# Patient Record
Sex: Female | Born: 1965
Health system: Southern US, Community
[De-identification: ages and names within clinical notes are randomized; demographics above are authoritative.]

## PROBLEM LIST (undated history)

## (undated) DIAGNOSIS — G809 Cerebral palsy, unspecified: Secondary | ICD-10-CM

## (undated) DIAGNOSIS — E039 Hypothyroidism, unspecified: Secondary | ICD-10-CM

## (undated) DIAGNOSIS — T7840XA Allergy, unspecified, initial encounter: Secondary | ICD-10-CM

## (undated) DIAGNOSIS — J45909 Unspecified asthma, uncomplicated: Secondary | ICD-10-CM

## (undated) HISTORY — PX: OTHER SURGICAL HISTORY: SHX169

## (undated) HISTORY — DX: Hypothyroidism, unspecified: E03.9

## (undated) HISTORY — PX: TONSILLECTOMY: SUR1361

## (undated) HISTORY — DX: Allergy, unspecified, initial encounter: T78.40XA

## (undated) HISTORY — DX: Unspecified asthma, uncomplicated: J45.909

## (undated) HISTORY — DX: Cerebral palsy, unspecified: G80.9

---

## 2006-05-23 ENCOUNTER — Ambulatory Visit: Payer: Self-pay | Admitting: Internal Medicine

## 2007-05-27 ENCOUNTER — Ambulatory Visit: Payer: Self-pay | Admitting: Internal Medicine

## 2010-07-13 ENCOUNTER — Ambulatory Visit: Payer: Self-pay

## 2011-08-10 DIAGNOSIS — E782 Mixed hyperlipidemia: Secondary | ICD-10-CM | POA: Diagnosis not present

## 2011-08-10 DIAGNOSIS — J45909 Unspecified asthma, uncomplicated: Secondary | ICD-10-CM | POA: Diagnosis not present

## 2011-08-10 DIAGNOSIS — E039 Hypothyroidism, unspecified: Secondary | ICD-10-CM | POA: Diagnosis not present

## 2011-08-10 DIAGNOSIS — G40802 Other epilepsy, not intractable, without status epilepticus: Secondary | ICD-10-CM | POA: Diagnosis not present

## 2011-08-10 DIAGNOSIS — J309 Allergic rhinitis, unspecified: Secondary | ICD-10-CM | POA: Diagnosis not present

## 2011-09-06 ENCOUNTER — Ambulatory Visit: Payer: Self-pay | Admitting: Internal Medicine

## 2011-09-06 DIAGNOSIS — Z1231 Encounter for screening mammogram for malignant neoplasm of breast: Secondary | ICD-10-CM | POA: Diagnosis not present

## 2012-02-07 DIAGNOSIS — Z23 Encounter for immunization: Secondary | ICD-10-CM | POA: Diagnosis not present

## 2012-02-07 DIAGNOSIS — J45909 Unspecified asthma, uncomplicated: Secondary | ICD-10-CM | POA: Diagnosis not present

## 2012-02-07 DIAGNOSIS — Z0000 Encounter for general adult medical examination without abnormal findings: Secondary | ICD-10-CM | POA: Diagnosis not present

## 2012-02-07 DIAGNOSIS — R300 Dysuria: Secondary | ICD-10-CM | POA: Diagnosis not present

## 2012-02-07 DIAGNOSIS — J309 Allergic rhinitis, unspecified: Secondary | ICD-10-CM | POA: Diagnosis not present

## 2012-05-30 DIAGNOSIS — E038 Other specified hypothyroidism: Secondary | ICD-10-CM | POA: Diagnosis not present

## 2012-05-30 DIAGNOSIS — Z0000 Encounter for general adult medical examination without abnormal findings: Secondary | ICD-10-CM | POA: Diagnosis not present

## 2012-08-07 DIAGNOSIS — J45909 Unspecified asthma, uncomplicated: Secondary | ICD-10-CM | POA: Diagnosis not present

## 2012-08-07 DIAGNOSIS — E039 Hypothyroidism, unspecified: Secondary | ICD-10-CM | POA: Diagnosis not present

## 2012-08-07 DIAGNOSIS — R0602 Shortness of breath: Secondary | ICD-10-CM | POA: Diagnosis not present

## 2012-08-07 DIAGNOSIS — J309 Allergic rhinitis, unspecified: Secondary | ICD-10-CM | POA: Diagnosis not present

## 2012-09-25 ENCOUNTER — Ambulatory Visit: Payer: Self-pay

## 2012-09-25 DIAGNOSIS — R922 Inconclusive mammogram: Secondary | ICD-10-CM | POA: Diagnosis not present

## 2012-09-25 DIAGNOSIS — Z1231 Encounter for screening mammogram for malignant neoplasm of breast: Secondary | ICD-10-CM | POA: Diagnosis not present

## 2013-03-03 DIAGNOSIS — Z0000 Encounter for general adult medical examination without abnormal findings: Secondary | ICD-10-CM | POA: Diagnosis not present

## 2013-03-03 DIAGNOSIS — E039 Hypothyroidism, unspecified: Secondary | ICD-10-CM | POA: Diagnosis not present

## 2013-03-03 DIAGNOSIS — J45901 Unspecified asthma with (acute) exacerbation: Secondary | ICD-10-CM | POA: Diagnosis not present

## 2013-03-03 DIAGNOSIS — J309 Allergic rhinitis, unspecified: Secondary | ICD-10-CM | POA: Diagnosis not present

## 2013-06-25 DIAGNOSIS — E559 Vitamin D deficiency, unspecified: Secondary | ICD-10-CM | POA: Diagnosis not present

## 2013-06-25 DIAGNOSIS — E038 Other specified hypothyroidism: Secondary | ICD-10-CM | POA: Diagnosis not present

## 2013-06-25 DIAGNOSIS — Z0000 Encounter for general adult medical examination without abnormal findings: Secondary | ICD-10-CM | POA: Diagnosis not present

## 2013-09-01 DIAGNOSIS — J45909 Unspecified asthma, uncomplicated: Secondary | ICD-10-CM | POA: Diagnosis not present

## 2013-09-01 DIAGNOSIS — J309 Allergic rhinitis, unspecified: Secondary | ICD-10-CM | POA: Diagnosis not present

## 2013-09-01 DIAGNOSIS — E039 Hypothyroidism, unspecified: Secondary | ICD-10-CM | POA: Diagnosis not present

## 2013-12-09 DIAGNOSIS — Z23 Encounter for immunization: Secondary | ICD-10-CM | POA: Diagnosis not present

## 2013-12-30 ENCOUNTER — Ambulatory Visit: Payer: Self-pay

## 2013-12-30 DIAGNOSIS — Z1231 Encounter for screening mammogram for malignant neoplasm of breast: Secondary | ICD-10-CM | POA: Diagnosis not present

## 2014-01-28 DIAGNOSIS — H53031 Strabismic amblyopia, right eye: Secondary | ICD-10-CM | POA: Diagnosis not present

## 2014-01-28 DIAGNOSIS — H50111 Monocular exotropia, right eye: Secondary | ICD-10-CM | POA: Diagnosis not present

## 2014-03-08 DIAGNOSIS — J309 Allergic rhinitis, unspecified: Secondary | ICD-10-CM | POA: Diagnosis not present

## 2014-03-08 DIAGNOSIS — J4520 Mild intermittent asthma, uncomplicated: Secondary | ICD-10-CM | POA: Diagnosis not present

## 2014-03-08 DIAGNOSIS — E039 Hypothyroidism, unspecified: Secondary | ICD-10-CM | POA: Diagnosis not present

## 2014-03-08 DIAGNOSIS — R300 Dysuria: Secondary | ICD-10-CM | POA: Diagnosis not present

## 2014-03-08 DIAGNOSIS — Z0001 Encounter for general adult medical examination with abnormal findings: Secondary | ICD-10-CM | POA: Diagnosis not present

## 2014-09-06 DIAGNOSIS — J4520 Mild intermittent asthma, uncomplicated: Secondary | ICD-10-CM | POA: Diagnosis not present

## 2014-09-06 DIAGNOSIS — J309 Allergic rhinitis, unspecified: Secondary | ICD-10-CM | POA: Diagnosis not present

## 2014-09-06 DIAGNOSIS — E039 Hypothyroidism, unspecified: Secondary | ICD-10-CM | POA: Diagnosis not present

## 2015-01-04 DIAGNOSIS — Z1231 Encounter for screening mammogram for malignant neoplasm of breast: Secondary | ICD-10-CM | POA: Diagnosis not present

## 2015-03-10 DIAGNOSIS — J4520 Mild intermittent asthma, uncomplicated: Secondary | ICD-10-CM | POA: Diagnosis not present

## 2015-03-10 DIAGNOSIS — J019 Acute sinusitis, unspecified: Secondary | ICD-10-CM | POA: Diagnosis not present

## 2015-03-10 DIAGNOSIS — J309 Allergic rhinitis, unspecified: Secondary | ICD-10-CM | POA: Diagnosis not present

## 2015-03-10 DIAGNOSIS — E039 Hypothyroidism, unspecified: Secondary | ICD-10-CM | POA: Diagnosis not present

## 2015-03-10 DIAGNOSIS — Z0001 Encounter for general adult medical examination with abnormal findings: Secondary | ICD-10-CM | POA: Diagnosis not present

## 2015-06-20 DIAGNOSIS — H40013 Open angle with borderline findings, low risk, bilateral: Secondary | ICD-10-CM | POA: Diagnosis not present

## 2015-07-29 DIAGNOSIS — E559 Vitamin D deficiency, unspecified: Secondary | ICD-10-CM | POA: Diagnosis not present

## 2015-07-29 DIAGNOSIS — E039 Hypothyroidism, unspecified: Secondary | ICD-10-CM | POA: Diagnosis not present

## 2015-07-29 DIAGNOSIS — Z0001 Encounter for general adult medical examination with abnormal findings: Secondary | ICD-10-CM | POA: Diagnosis not present

## 2015-10-13 DIAGNOSIS — E039 Hypothyroidism, unspecified: Secondary | ICD-10-CM | POA: Diagnosis not present

## 2015-10-13 DIAGNOSIS — J309 Allergic rhinitis, unspecified: Secondary | ICD-10-CM | POA: Diagnosis not present

## 2015-10-13 DIAGNOSIS — J4520 Mild intermittent asthma, uncomplicated: Secondary | ICD-10-CM | POA: Diagnosis not present

## 2016-01-24 DIAGNOSIS — Z1231 Encounter for screening mammogram for malignant neoplasm of breast: Secondary | ICD-10-CM | POA: Diagnosis not present

## 2016-04-24 DIAGNOSIS — E039 Hypothyroidism, unspecified: Secondary | ICD-10-CM | POA: Diagnosis not present

## 2016-04-24 DIAGNOSIS — G40802 Other epilepsy, not intractable, without status epilepticus: Secondary | ICD-10-CM | POA: Diagnosis not present

## 2016-04-24 DIAGNOSIS — J4520 Mild intermittent asthma, uncomplicated: Secondary | ICD-10-CM | POA: Diagnosis not present

## 2016-04-24 DIAGNOSIS — J309 Allergic rhinitis, unspecified: Secondary | ICD-10-CM | POA: Diagnosis not present

## 2016-04-24 DIAGNOSIS — Z0001 Encounter for general adult medical examination with abnormal findings: Secondary | ICD-10-CM | POA: Diagnosis not present

## 2016-08-21 DIAGNOSIS — H53031 Strabismic amblyopia, right eye: Secondary | ICD-10-CM | POA: Diagnosis not present

## 2016-08-21 DIAGNOSIS — H50111 Monocular exotropia, right eye: Secondary | ICD-10-CM | POA: Diagnosis not present

## 2016-08-21 DIAGNOSIS — H40013 Open angle with borderline findings, low risk, bilateral: Secondary | ICD-10-CM | POA: Diagnosis not present

## 2016-09-21 ENCOUNTER — Other Ambulatory Visit: Payer: Self-pay | Admitting: *Deleted

## 2016-09-21 NOTE — Patient Outreach (Signed)
[facility]) Care Management  09/21/2016  Karen SereneMylinda A Elliott 10/16/1965 [id number]   Member identified as high risk according to [employer] health questionnaire.  Call placed to introduce [facility] and perform telephone screening.  No answer, HIPAA compliant voice message left.  Will await call back, if no call back, will follow up within the next week.  Kemper DurieMonica Delsy Etzkorn, [place]RN, MSN [facility] Care Management  [facility] Care Manager [phone]

## 2016-09-24 ENCOUNTER — Other Ambulatory Visit: Payer: Self-pay | Admitting: *Deleted

## 2016-09-24 NOTE — Patient Outreach (Signed)
[facility]) Care Management  09/24/2016  Vicente SereneMylinda A Petersheim 08/05/1965 [id number]   2nd attempt made to call placed to introduce [facility] care management services and perform telephone screening.  No answer, HIPAA compliant voice message left.  Will await call back, if no call back, will follow up within the next week.  Kemper DurieMonica Savannah Morford, [place]RN, MSN [facility] Care Management  [facility] Care Manager [phone]

## 2016-09-25 ENCOUNTER — Other Ambulatory Visit: Payer: Self-pay | Admitting: *Deleted

## 2016-09-25 ENCOUNTER — Encounter: Payer: Self-pay | Admitting: *Deleted

## 2016-09-25 NOTE — Patient Outreach (Signed)
[facility]) Care Management  09/25/2016  Karen SereneMylinda A Elliott 11/27/1965 [id number]   3rd attempt made to call placed to introduce [name] care [facility] services and perform telephone screening.  No answer, HIPAA compliant voice message left.  Will await call back.  Will send outreach letter and [facility] information [facility] in mail.  If call back received, will perform screening at that time.  Kemper DurieMonica Peightyn Roberson, [place]RN, MSN [facility] Care Management  [facility] Care Manager [phone]

## 2016-10-23 DIAGNOSIS — G809 Cerebral palsy, unspecified: Secondary | ICD-10-CM | POA: Diagnosis not present

## 2016-10-23 DIAGNOSIS — E039 Hypothyroidism, unspecified: Secondary | ICD-10-CM | POA: Diagnosis not present

## 2016-10-23 DIAGNOSIS — J4520 Mild intermittent asthma, uncomplicated: Secondary | ICD-10-CM | POA: Diagnosis not present

## 2016-10-23 DIAGNOSIS — J309 Allergic rhinitis, unspecified: Secondary | ICD-10-CM | POA: Diagnosis not present

## 2017-02-21 DIAGNOSIS — Z1231 Encounter for screening mammogram for malignant neoplasm of breast: Secondary | ICD-10-CM | POA: Diagnosis not present

## 2017-05-01 ENCOUNTER — Other Ambulatory Visit: Payer: Self-pay

## 2017-05-01 MED ORDER — FLUTICASONE-SALMETEROL 100-50 MCG/DOSE IN AEPB: 1.0000 | INHALATION_SPRAY | Freq: Two times a day (BID) | RESPIRATORY_TRACT | 3 refills | Status: DC

## 2017-06-17 ENCOUNTER — Ambulatory Visit (INDEPENDENT_AMBULATORY_CARE_PROVIDER_SITE_OTHER): Payer: PPO | Admitting: Nurse Practitioner

## 2017-06-17 ENCOUNTER — Encounter: Payer: Self-pay | Admitting: Nurse Practitioner

## 2017-06-17 VITALS — BP 138/82 | Resp 16 | Ht 62.5 in | Wt 118.6 lb

## 2017-06-17 DIAGNOSIS — R300 Dysuria: Secondary | ICD-10-CM | POA: Diagnosis not present

## 2017-06-17 DIAGNOSIS — E039 Hypothyroidism, unspecified: Secondary | ICD-10-CM

## 2017-06-17 DIAGNOSIS — J45909 Unspecified asthma, uncomplicated: Secondary | ICD-10-CM

## 2017-06-17 DIAGNOSIS — Z0001 Encounter for general adult medical examination with abnormal findings: Secondary | ICD-10-CM | POA: Diagnosis not present

## 2017-06-17 DIAGNOSIS — Z13820 Encounter for screening for osteoporosis: Secondary | ICD-10-CM | POA: Insufficient documentation

## 2017-06-17 MED ORDER — LEVOTHYROXINE SODIUM 50 MCG PO TABS: 50.0000 ug | ORAL_TABLET | Freq: Every day | ORAL | 5 refills | Status: DC

## 2017-06-17 NOTE — Progress Notes (Signed)
[facility] [address] [place], [place] [address]  Internal MEDICINE  Office Visit Note  Patient Name: Karen Elliott  [id number]  [id number]  Date of Service: 06/17/2017  Chief Complaint  Patient presents with  . Hypothyroidism     The patient is here for health maintenance exam. She has no physical concerns or complaints. She does believe she is going through menopause. Her last menstrual cycle on 02/09/2017 and again 02/20/2017. She has not had another menstrual cycle since then.  Her breathing is doing well. Continues to use her maintenance inhaler. Will use her rescue inhaler as needed.  Most recent mammogram done 02/2017 and was benign. She does have lab slip to get her routine blood work done, which she has not had the opportunity to do.   Pt is here for routine health maintenance examination  Current Medication: Outpatient Encounter Medications as of 06/17/2017  Medication Sig  . Fluticasone-Salmeterol (ADVAIR DISKUS) 100-50 MCG/DOSE AEPB Inhale 1 puff into the lungs 2 (two) times daily.  Marland Kitchen GLUCOSAMINE-CALCIUM-VIT D PO Take by mouth.  . levothyroxine (SYNTHROID, LEVOTHROID) 50 MCG tablet Take 1 tablet (50 mcg total) by mouth daily before breakfast.  . montelukast (SINGULAIR) 10 MG tablet   . Multiple Vitamin (MULTIVITAMIN) capsule Take 1 capsule by mouth daily.  Marland Kitchen PROAIR HFA 108 (90 Base) MCG/ACT inhaler   . [DISCONTINUED] levothyroxine (SYNTHROID, LEVOTHROID) 50 MCG tablet    No facility-administered encounter medications on file as of 06/17/2017.     Surgical History: History reviewed. No pertinent surgical history.  Medical History: Past Medical History:  Diagnosis Date  . Allergy   . Asthma   . Cerebral palsy (HCC)   . Hypothyroidism     Family History: Family History  Problem Relation Age of Onset  . Asthma Mother   . Thyroid cancer Mother   . Bladder Cancer Father   . Asthma Sister   . Asthma Brother   . GER disease Brother        Review of Systems  Constitutional: Negative for activity change, chills, fatigue and unexpected weight change.  HENT: Negative for congestion, postnasal drip, rhinorrhea, sneezing and sore throat.   Eyes: Negative.  Negative for redness.  Respiratory: Positive for wheezing. Negative for cough, chest tightness and shortness of breath.   Cardiovascular: Negative for chest pain and palpitations.  Gastrointestinal: Negative for abdominal distention, abdominal pain, constipation, diarrhea, nausea and vomiting.  Endocrine: Negative for cold intolerance, heat intolerance, polydipsia, polyphagia and polyuria.       Well controlled thyroid disease.   Genitourinary: Negative.  Negative for dysuria and frequency.  Musculoskeletal: Negative for arthralgias, back pain, joint swelling and neck pain.       Her joints "pop and crack" when it's cold outside. Cold seems to make her muscles ache.   Skin: Negative for rash.  Allergic/Immunologic: Positive for environmental allergies.  Neurological: Negative for tremors, weakness, numbness and headaches.  Hematological: Negative for adenopathy. Does not bruise/bleed easily.  Psychiatric/Behavioral: Negative for behavioral problems (Depression), sleep disturbance and suicidal ideas. The patient is nervous/anxious.      Today's Vitals   06/17/17 1411  BP: 138/82  Resp: 16  SpO2: 99%  Weight: 118 lb 9.6 oz (53.8 kg)  Height: 5' 2.5" (1.588 m)    Physical Exam  Constitutional: She is oriented to person, place, and time. She appears well-developed and well-nourished. No distress.  HENT:  Head: Normocephalic and atraumatic.  Mouth/Throat: Oropharynx is clear and moist. No oropharyngeal  exudate.  Eyes: Pupils are equal, round, and reactive to light. EOM are normal.  Neck: Normal range of motion. Neck supple. No JVD present. No tracheal deviation present. No thyromegaly present.  Cardiovascular: Normal rate, regular rhythm and normal heart sounds.  Exam reveals no gallop and no friction rub.  No murmur heard. Pulmonary/Chest: Effort normal. No respiratory distress. She has no wheezes. She has no rales. She exhibits no tenderness.  Abdominal: Soft. Bowel sounds are normal.  Musculoskeletal: Normal range of motion.  Lymphadenopathy:    She has no cervical adenopathy.  Neurological: She is alert and oriented to person, place, and time. No cranial nerve deficit.  Skin: Skin is warm and dry. She is not diaphoretic.  Psychiatric: She has a normal mood and affect. Her behavior is normal. Judgment and thought content normal.  Nursing note and vitals reviewed.   Assessment/Plan: 1. Encounter for general adult medical examination with abnormal findings Annual wellness visit today  2. Moderate asthma without complication, unspecified whether persistent Continue advair twice daily. Rescue inhaler as needed and as prescribed   3. Acquired hypothyroidism Will check thyroid panel. Adjust levothyroxine dose as indicated.  - levothyroxine (SYNTHROID, LEVOTHROID) 50 MCG tablet; Take 1 tablet (50 mcg total) by mouth daily before breakfast.  Dispense: 30 tablet; Refill: 5  4. Dysuria - Urinalysis, Routine w reflex microscopic  General Counseling: [name] verbalizes understanding of the findings of todays visit and agrees with plan of treatment. I have discussed any further diagnostic evaluation that may be needed or ordered today. We also reviewed her medications today. she has been encouraged to call the office with any questions or concerns that should arise related to todays visit.  This patient was seen by Vincent GrosHeather Ahmir Bracken, FNP- C in Collaboration with Dr Lyndon CodeFozia M Khan as a part of collaborative care agreement    Orders Placed This Encounter  Procedures  . Urinalysis, Routine w reflex microscopic    Meds ordered this encounter  Medications  . levothyroxine (SYNTHROID, LEVOTHROID) 50 MCG tablet    Sig: Take 1 tablet (50 mcg total) by mouth  daily before breakfast.    Dispense:  30 tablet    Refill:  5    Order Specific Question:   Supervising Provider    Answer:   Lyndon CodeKHAN, FOZIA M [[id number]]    Time spent: 2030 Minutes      Lyndon CodeFozia M Khan, MD  Internal Medicine

## 2017-06-18 LAB — URINALYSIS, ROUTINE W REFLEX MICROSCOPIC
BILIRUBIN UA: NEGATIVE
Glucose, UA: NEGATIVE
KETONES UA: NEGATIVE
LEUKOCYTES UA: NEGATIVE
NITRITE UA: NEGATIVE
Protein, UA: NEGATIVE
RBC UA: NEGATIVE
UUROB: 0.2 mg/dL (ref 0.2–1.0)
pH, UA: 8 — ABNORMAL HIGH (ref 5.0–7.5)

## 2017-06-25 ENCOUNTER — Other Ambulatory Visit: Payer: Self-pay | Admitting: Internal Medicine

## 2017-07-09 ENCOUNTER — Other Ambulatory Visit: Payer: Self-pay | Admitting: Nurse Practitioner

## 2017-07-09 DIAGNOSIS — Z0001 Encounter for general adult medical examination with abnormal findings: Secondary | ICD-10-CM | POA: Diagnosis not present

## 2017-07-09 DIAGNOSIS — E559 Vitamin D deficiency, unspecified: Secondary | ICD-10-CM | POA: Diagnosis not present

## 2017-07-09 DIAGNOSIS — E039 Hypothyroidism, unspecified: Secondary | ICD-10-CM | POA: Diagnosis not present

## 2017-07-10 LAB — COMPREHENSIVE METABOLIC PANEL
A/G RATIO: 2.1 (ref 1.2–2.2)
ALT: 37 IU/L — ABNORMAL HIGH (ref 0–32)
AST: 36 IU/L (ref 0–40)
Albumin: 4.9 g/dL (ref 3.5–5.5)
Alkaline Phosphatase: 131 IU/L — ABNORMAL HIGH (ref 39–117)
BILIRUBIN TOTAL: 0.3 mg/dL (ref 0.0–1.2)
BUN/Creatinine Ratio: 19 (ref 9–23)
BUN: 12 mg/dL (ref 6–24)
CHLORIDE: 96 mmol/L (ref 96–106)
CO2: 26 mmol/L (ref 20–29)
Calcium: 10.1 mg/dL (ref 8.7–10.2)
Creatinine, Ser: 0.63 mg/dL (ref 0.57–1.00)
GFR calc non Af Amer: 104 mL/min/{1.73_m2} (ref >59)
GFR, EST AFRICAN AMERICAN: 120 mL/min/{1.73_m2} (ref >59)
Globulin, Total: 2.3 g/dL (ref 1.5–4.5)
Glucose: 99 mg/dL (ref 65–99)
POTASSIUM: 4.2 mmol/L (ref 3.5–5.2)
Sodium: 138 mmol/L (ref 134–144)
TOTAL PROTEIN: 7.2 g/dL (ref 6.0–8.5)

## 2017-07-10 LAB — TSH: TSH: 3.87 u[IU]/mL (ref 0.450–4.500)

## 2017-07-10 LAB — CBC
HEMATOCRIT: 39.8 % (ref 34.0–46.6)
Hemoglobin: 13.4 g/dL (ref 11.1–15.9)
MCH: 29.7 pg (ref 26.6–33.0)
MCHC: 33.7 g/dL (ref 31.5–35.7)
MCV: 88 fL (ref 79–97)
Platelets: 248 10*3/uL (ref 150–379)
RBC: 4.51 x10E6/uL (ref 3.77–5.28)
RDW: 13.3 % (ref 12.3–15.4)
WBC: 5.1 10*3/uL (ref 3.4–10.8)

## 2017-07-10 LAB — LIPID PANEL W/O CHOL/HDL RATIO
Cholesterol, Total: 252 mg/dL — ABNORMAL HIGH (ref 100–199)
HDL: 100 mg/dL (ref >39)
LDL CALC: 140 mg/dL — AB (ref 0–99)
TRIGLYCERIDES: 58 mg/dL (ref 0–149)
VLDL Cholesterol Cal: 12 mg/dL (ref 5–40)

## 2017-07-10 LAB — VITAMIN D 25 HYDROXY (VIT D DEFICIENCY, FRACTURES): VIT D 25 HYDROXY: 40.6 ng/mL (ref 30.0–100.0)

## 2017-07-10 LAB — T4, FREE: Free T4: 1.41 ng/dL (ref 0.82–1.77)

## 2017-07-26 ENCOUNTER — Other Ambulatory Visit: Payer: Self-pay

## 2017-07-26 MED ORDER — CETIRIZINE HCL 10 MG PO TABS: 10.0000 mg | ORAL_TABLET | Freq: Every day | ORAL | 3 refills | Status: DC

## 2017-09-12 ENCOUNTER — Other Ambulatory Visit: Payer: Self-pay

## 2017-09-12 ENCOUNTER — Other Ambulatory Visit: Payer: Self-pay | Admitting: Nurse Practitioner

## 2017-09-12 MED ORDER — FLUTICASONE-SALMETEROL 100-50 MCG/DOSE IN AEPB: 1.0000 | INHALATION_SPRAY | Freq: Two times a day (BID) | RESPIRATORY_TRACT | 5 refills | Status: DC

## 2017-09-18 ENCOUNTER — Other Ambulatory Visit: Payer: Self-pay

## 2017-09-18 ENCOUNTER — Other Ambulatory Visit: Payer: Self-pay | Admitting: Nurse Practitioner

## 2017-09-18 DIAGNOSIS — E039 Hypothyroidism, unspecified: Secondary | ICD-10-CM

## 2017-09-18 MED ORDER — LEVOTHYROXINE SODIUM 50 MCG PO TABS: 50.0000 ug | ORAL_TABLET | Freq: Every day | ORAL | 5 refills | Status: DC

## 2017-09-25 ENCOUNTER — Other Ambulatory Visit: Payer: Self-pay | Admitting: Nurse Practitioner

## 2017-09-25 DIAGNOSIS — E039 Hypothyroidism, unspecified: Secondary | ICD-10-CM

## 2017-09-25 MED ORDER — CETIRIZINE HCL 10 MG PO TABS: 10.0000 mg | ORAL_TABLET | Freq: Every day | ORAL | 3 refills | Status: DC

## 2017-09-25 MED ORDER — MONTELUKAST SODIUM 10 MG PO TABS: 10.0000 mg | ORAL_TABLET | Freq: Every day | ORAL | 2 refills | Status: DC

## 2017-09-25 MED ORDER — LEVOTHYROXINE SODIUM 50 MCG PO TABS: 50.0000 ug | ORAL_TABLET | Freq: Every day | ORAL | 5 refills | Status: DC

## 2017-09-30 ENCOUNTER — Other Ambulatory Visit: Payer: Self-pay | Admitting: Nurse Practitioner

## 2017-09-30 MED ORDER — PROAIR HFA 108 (90 BASE) MCG/ACT IN AERS: 2.0000 | INHALATION_SPRAY | Freq: Four times a day (QID) | RESPIRATORY_TRACT | 3 refills | Status: DC | PRN

## 2017-10-14 ENCOUNTER — Ambulatory Visit: Payer: PPO | Admitting: Podiatry

## 2017-10-14 ENCOUNTER — Encounter: Payer: Self-pay | Admitting: Podiatry

## 2017-10-14 VITALS — BP 142/83 | HR 87

## 2017-10-14 DIAGNOSIS — M205X9 Other deformities of toe(s) (acquired), unspecified foot: Secondary | ICD-10-CM | POA: Diagnosis not present

## 2017-10-14 DIAGNOSIS — M2020 Hallux rigidus, unspecified foot: Principal | ICD-10-CM

## 2017-10-14 DIAGNOSIS — M2170 Unequal limb length (acquired), unspecified site: Secondary | ICD-10-CM | POA: Diagnosis not present

## 2017-10-14 DIAGNOSIS — R2689 Other abnormalities of gait and mobility: Secondary | ICD-10-CM

## 2017-10-14 NOTE — Progress Notes (Signed)
This patient presents the office requesting an evaluation of both feet.  She says that she has pain and discomfort through her  feet when she walks.  She says she frequently has hip pain by the end of the day.  She admits havi a limb length with her left leg shorter than her right leg and she is concerned with her balance.  She also says that she has pain through her feet and desires to receive inserts to wear in her shoe.  She walks with difficulty due to her limb length discrepancy.  She presents the office today for an evaluation and treatment of her feet.  General Appearance  Alert, conversant and in no acute stress.  Vascular  Dorsalis pedis and posterior tibial  pulses are palpable  bilaterally.  Capillary return is within normal limits  bilaterally. Temperature is within normal limits  bilaterally.  Neurologic  Senn-Weinstein monofilament wire test within normal limits  bilaterally. Muscle power within normal limits bilaterally.  Nails Thick disfigured discolored nails with subungual debris  from hallux to fifth toes bilaterally. No evidence of bacterial infection or drainage bilaterally.  Orthopedic  No limitations of motion of motion feet .  No crepitus or effusions noted.  No bony pathology or digital deformities noted. Patient has contracted and  dorsiflexed digits  Bilaterally.    Skin  normotropic skin with multiple porokeratotic lesions both feet. .  No signs of infections or ulcers noted.    Limb length  Balance difficulties.  IE.  Dispense powerstep insoles  B/l.  Patient was given an appointment with a rake to evaluate her gait and her balance and her limb length discrepancy.  RTC prn   Helane GuntherGregory Jae Bruck DPM

## 2017-10-23 ENCOUNTER — Other Ambulatory Visit: Payer: PPO | Admitting: Orthotics

## 2017-11-20 ENCOUNTER — Ambulatory Visit: Payer: PPO | Admitting: Orthotics

## 2017-11-20 DIAGNOSIS — M205X9 Other deformities of toe(s) (acquired), unspecified foot: Secondary | ICD-10-CM

## 2017-11-20 DIAGNOSIS — R2689 Other abnormalities of gait and mobility: Secondary | ICD-10-CM

## 2017-11-20 DIAGNOSIS — M2020 Hallux rigidus, unspecified foot: Principal | ICD-10-CM

## 2017-11-20 NOTE — Progress Notes (Signed)
Patient presents with gait instability and hx of CP.   She has significant (1/2") leg length disprency which also affects her balance.   She may be an candidtate for future balance braces, however, she needs a shoe lift first.  Gave her rx to [facility] for 1/2" left lift; as I put 1/2" 55 durometer crepe under her left foot and her hips aligned.

## 2017-12-17 ENCOUNTER — Ambulatory Visit: Payer: Self-pay | Admitting: Adult Health

## 2017-12-26 ENCOUNTER — Other Ambulatory Visit: Payer: Self-pay | Admitting: Nurse Practitioner

## 2017-12-26 MED ORDER — MONTELUKAST SODIUM 10 MG PO TABS: 10.0000 mg | ORAL_TABLET | Freq: Every day | ORAL | 2 refills | Status: DC

## 2017-12-30 ENCOUNTER — Other Ambulatory Visit: Payer: Self-pay

## 2017-12-30 NOTE — Patient Outreach (Signed)
[facility]) Care Management  12/30/2017  Karen Elliott 1966/02/11 [id number]   Referral Date: 12/30/17 Referral Source: [facility]TA Concierge Referral Reason: Transportation resources   Outreach Attempt: spoke with patient.  She is able to verify HIPAA.  Discussed reason for referral. Patient reports that she right now has transportation to appointments but was just wondering about other transportation.  Discussed with patient [facility] services and referral to social worker for transportation resources.  Patient states that she does not wish to pursue right now since she does have transportation but will keep CM contact information.  Advised patient that CM would send letter and brochure for future reference.  She verbalized understanding.    Plan: RN CM will send letter and brochure and close case at this time.     Bary Leriche, RN, MSN [facility] Care Management Care Management Coordinator Direct Line [phone] Toll Free: [phone]  Fax: [phone]

## 2018-01-01 ENCOUNTER — Ambulatory Visit: Payer: PPO | Admitting: Orthotics

## 2018-01-01 DIAGNOSIS — M2170 Unequal limb length (acquired), unspecified site: Secondary | ICD-10-CM

## 2018-01-01 DIAGNOSIS — R2689 Other abnormalities of gait and mobility: Secondary | ICD-10-CM

## 2018-01-01 NOTE — Progress Notes (Signed)
Patient came into feeling offbalance due to 1/2" shoe lift.   When measured the shoe lfit was actually 3/4" (too much).  I directed her to go back to [facility] to get fixed.

## 2018-03-06 ENCOUNTER — Telehealth: Payer: Self-pay | Admitting: Nurse Practitioner

## 2018-03-06 NOTE — Telephone Encounter (Signed)
CALLED TO RESCHEDULE 06/23/18 APPOINTMENT.JW

## 2018-03-14 ENCOUNTER — Other Ambulatory Visit: Payer: Self-pay

## 2018-03-14 MED ORDER — FLUTICASONE-SALMETEROL 100-50 MCG/DOSE IN AEPB: 1.0000 | INHALATION_SPRAY | Freq: Two times a day (BID) | RESPIRATORY_TRACT | 5 refills | Status: DC

## 2018-03-28 ENCOUNTER — Other Ambulatory Visit: Payer: Self-pay

## 2018-03-28 MED ORDER — CETIRIZINE HCL 10 MG PO TABS: 10.0000 mg | ORAL_TABLET | Freq: Every day | ORAL | 3 refills | Status: DC

## 2018-03-28 MED ORDER — MONTELUKAST SODIUM 10 MG PO TABS: 10.0000 mg | ORAL_TABLET | Freq: Every day | ORAL | 2 refills | Status: DC

## 2018-04-10 ENCOUNTER — Telehealth: Payer: Self-pay | Admitting: Nurse Practitioner

## 2018-04-10 NOTE — Telephone Encounter (Signed)
Called patient again to reschedule and the number is disconnected.jw

## 2018-05-26 ENCOUNTER — Telehealth: Payer: Self-pay | Admitting: Nurse Practitioner

## 2018-05-26 NOTE — Telephone Encounter (Signed)
Mailed letter to reschedule April 6th appointment.jw

## 2018-06-16 DIAGNOSIS — R269 Unspecified abnormalities of gait and mobility: Secondary | ICD-10-CM | POA: Diagnosis not present

## 2018-06-16 DIAGNOSIS — G40301 Generalized idiopathic epilepsy and epileptic syndromes, not intractable, with status epilepticus: Secondary | ICD-10-CM | POA: Diagnosis not present

## 2018-06-23 ENCOUNTER — Ambulatory Visit: Payer: Self-pay | Admitting: Nurse Practitioner

## 2018-06-25 ENCOUNTER — Other Ambulatory Visit: Payer: Self-pay | Admitting: Nurse Practitioner

## 2018-06-26 ENCOUNTER — Other Ambulatory Visit: Payer: Self-pay

## 2018-06-26 MED ORDER — MONTELUKAST SODIUM 10 MG PO TABS: 10.0000 mg | ORAL_TABLET | Freq: Every day | ORAL | 0 refills | Status: DC

## 2018-06-26 NOTE — Telephone Encounter (Signed)
Try to call pt several no answer send 30 med for singulair  Need appt for further refills and also lmo with phar pt need appt fo further refills

## 2018-07-23 ENCOUNTER — Other Ambulatory Visit: Payer: Self-pay

## 2018-07-23 MED ORDER — MONTELUKAST SODIUM 10 MG PO TABS: 10.0000 mg | ORAL_TABLET | Freq: Every day | ORAL | 1 refills | Status: DC

## 2018-07-23 MED ORDER — CETIRIZINE HCL 10 MG PO TABS: 10.0000 mg | ORAL_TABLET | Freq: Every day | ORAL | 1 refills | Status: DC

## 2018-08-21 ENCOUNTER — Ambulatory Visit: Payer: Self-pay | Admitting: Nurse Practitioner

## 2018-09-10 ENCOUNTER — Other Ambulatory Visit: Payer: Self-pay

## 2018-09-10 DIAGNOSIS — E039 Hypothyroidism, unspecified: Secondary | ICD-10-CM

## 2018-09-10 MED ORDER — LEVOTHYROXINE SODIUM 50 MCG PO TABS: 50.0000 ug | ORAL_TABLET | Freq: Every day | ORAL | 1 refills | Status: DC

## 2018-09-10 MED ORDER — FLUTICASONE-SALMETEROL 100-50 MCG/DOSE IN AEPB: 1.0000 | INHALATION_SPRAY | Freq: Two times a day (BID) | RESPIRATORY_TRACT | 1 refills | Status: DC

## 2018-09-17 ENCOUNTER — Other Ambulatory Visit: Payer: Self-pay

## 2018-09-17 MED ORDER — MONTELUKAST SODIUM 10 MG PO TABS: 10.0000 mg | ORAL_TABLET | Freq: Every day | ORAL | 1 refills | Status: DC

## 2018-09-24 ENCOUNTER — Other Ambulatory Visit: Payer: Self-pay

## 2018-09-24 MED ORDER — CETIRIZINE HCL 10 MG PO TABS: 10.0000 mg | ORAL_TABLET | Freq: Every day | ORAL | 0 refills | Status: DC

## 2018-09-24 NOTE — Telephone Encounter (Signed)
Pt advised keep appt on 10/02/17 otherwise unable to do pres

## 2018-10-03 ENCOUNTER — Other Ambulatory Visit: Payer: Self-pay

## 2018-10-03 ENCOUNTER — Encounter: Payer: Self-pay | Admitting: Nurse Practitioner

## 2018-10-03 ENCOUNTER — Ambulatory Visit (INDEPENDENT_AMBULATORY_CARE_PROVIDER_SITE_OTHER): Payer: PPO | Admitting: Nurse Practitioner

## 2018-10-03 VITALS — Ht 62.0 in | Wt 120.0 lb

## 2018-10-03 DIAGNOSIS — J45909 Unspecified asthma, uncomplicated: Secondary | ICD-10-CM

## 2018-10-03 DIAGNOSIS — E039 Hypothyroidism, unspecified: Secondary | ICD-10-CM

## 2018-10-03 DIAGNOSIS — Z0001 Encounter for general adult medical examination with abnormal findings: Secondary | ICD-10-CM

## 2018-10-03 DIAGNOSIS — G809 Cerebral palsy, unspecified: Secondary | ICD-10-CM

## 2018-10-03 NOTE — Progress Notes (Signed)
[facility] [address] [place], [place] [address]  Internal MEDICINE  Telephone Visit  Patient Name: Karen SereneMylinda A Elliott  [id number]  [id number]  Date of Service: 10/12/2018  I connected with the patient at 12:39pm by telephone and verified the patients identity using two identifiers.   I discussed the limitations, risks, security and privacy concerns of performing an evaluation and management service by telephone and the availability of in person appointments. I also discussed with the patient that there may be a patient responsible charge related to the service.  The patient expressed understanding and agrees to proceed.    Chief Complaint  Patient presents with  . Telephone Screen    PHONE VISIT [phone]  . Telephone Assessment  . Medicare Wellness  . Hypothyroidism  . Asthma  . Cerebral Palsy    The patient has been contacted via telephone for follow up visit due to concerns for spread of novel coronavirus. The patient is contacted for health maintenance exam. She does have chronic asthma and severe allergies. Does get dry skin on elbows and knees. Will use hydrocortisone cream as needed which does help. She has been practicing social distancing and has not come in contact with anyone who has COVID 19 to her knowledge.       Current Medication: Outpatient Encounter Medications as of 10/03/2018  Medication Sig  . cetirizine (ZYRTEC) 10 MG tablet Take 1 tablet (10 mg total) by mouth daily.  . Fluticasone-Salmeterol (ADVAIR DISKUS) 100-50 MCG/DOSE AEPB Inhale 1 puff into the lungs 2 (two) times daily.  Marland Kitchen. GLUCOSAMINE-CALCIUM-VIT D PO Take by mouth.  . levothyroxine (SYNTHROID) 50 MCG tablet Take 1 tablet (50 mcg total) by mouth daily before breakfast.  . montelukast (SINGULAIR) 10 MG tablet Take 1 tablet (10 mg total) by mouth daily.  . Multiple Vitamin (MULTIVITAMIN) capsule Take 1 capsule by mouth daily.  Marland Kitchen. PROAIR HFA 108 (90 Base) MCG/ACT inhaler Inhale 2 puffs  into the lungs every 6 (six) hours as needed for wheezing or shortness of breath.  . [DISCONTINUED] cetirizine (ZYRTEC) 10 MG tablet Take 1 tablet (10 mg total) by mouth daily.  . [DISCONTINUED] Fluticasone-Salmeterol (ADVAIR DISKUS) 100-50 MCG/DOSE AEPB Inhale 1 puff into the lungs 2 (two) times daily.  . [DISCONTINUED] levothyroxine (SYNTHROID, LEVOTHROID) 50 MCG tablet Take 1 tablet (50 mcg total) by mouth daily before breakfast.  . [DISCONTINUED] montelukast (SINGULAIR) 10 MG tablet Take 1 tablet (10 mg total) by mouth daily.   No facility-administered encounter medications on file as of 10/03/2018.     Surgical History: History reviewed. No pertinent surgical history.  Medical History: Past Medical History:  Diagnosis Date  . Allergy   . Asthma   . Cerebral palsy (HCC)   . Hypothyroidism     Family History: Family History  Problem Relation Age of Onset  . Asthma Mother   . Thyroid cancer Mother   . Bladder Cancer Father   . Asthma Sister   . Asthma Brother   . GER disease Brother     Social History   Socioeconomic History  . Marital status: Single    Spouse name: Not on file  . Number of children: Not on file  . Years of education: Not on file  . Highest education level: Not on file  Occupational History  . Not on file  Social Needs  . Financial resource strain: Not on file  . Food insecurity    Worry: Not on file    Inability: Not on  file  . Transportation needs    Medical: Not on file    Non-medical: Not on file  Tobacco Use  . Smoking status: Never Smoker  . Smokeless tobacco: Never Used  Substance and Sexual Activity  . Alcohol use: Never    Frequency: Never  . Drug use: Never  . Sexual activity: Not on file  Lifestyle  . Physical activity    Days per week: Not on file    Minutes per session: Not on file  . Stress: Not on file  Relationships  . Social Herbalist on phone: Not on file    Gets together: Not on file    Attends  religious service: Not on file    Active member of club or organization: Not on file    Attends meetings of clubs or organizations: Not on file    Relationship status: Not on file  . Intimate partner violence    Fear of current or ex partner: Not on file    Emotionally abused: Not on file    Physically abused: Not on file    Forced sexual activity: Not on file  Other Topics Concern  . Not on file  Social History Narrative  . Not on file      Review of Systems  Constitutional: Negative for activity change, chills, fatigue and unexpected weight change.  HENT: Negative for congestion, postnasal drip, rhinorrhea, sneezing and sore throat.   Respiratory: Positive for wheezing. Negative for cough, chest tightness and shortness of breath.        Intermittent  Cardiovascular: Negative for chest pain and palpitations.  Gastrointestinal: Negative for abdominal distention, abdominal pain, constipation, diarrhea, nausea and vomiting.  Endocrine: Negative for cold intolerance, heat intolerance, polydipsia and polyuria.       Well controlled thyroid disease.   Genitourinary: Negative for dysuria and frequency.  Musculoskeletal: Positive for gait problem. Negative for arthralgias, back pain, joint swelling and neck pain.       Her joints "pop and crack" when it's cold outside. Cold seems to make her muscles ache.   Skin: Negative for rash.  Allergic/Immunologic: Positive for environmental allergies.  Neurological: Negative for tremors, weakness, numbness and headaches.  Hematological: Negative for adenopathy. Does not bruise/bleed easily.  Psychiatric/Behavioral: Negative for behavioral problems (Depression), sleep disturbance and suicidal ideas. The patient is nervous/anxious.     Today's Vitals   10/03/18 1117  Weight: 120 lb (54.4 kg)  Height: 5\' 2"  (1.575 m)   Body mass index is 21.95 kg/m.  Observation/Objective:   The patient is alert and oriented. She is pleasant and answers  all questions appropriately. Breathing is non-labored. She is in no acute distress at this time.    Assessment/Plan: 1. Encounter for general adult medical examination with abnormal findings Annual health maintenance exam today.  2. Acquired hypothyroidism Continue levothyroxine as prescribed   3. Moderate asthma without complication, unspecified whether persistent Stable. Continue inhalers and respiratory medications as prescribed   4. Cerebral palsy, unspecified type ([place]) Stable.   General Counseling: Karen Elliott verbalizes understanding of the findings of today's phone visit and agrees with plan of treatment. I have discussed any further diagnostic evaluation that may be needed or ordered today. We also reviewed her medications today. she has been encouraged to call the office with any questions or concerns that should arise related to todays visit.  This patient was seen by Leretha Pol FNP Collaboration with Dr Lavera Guise as a part of collaborative  care agreement  Time spent: 7230 Minutes    Dr Lyndon CodeFozia M Khan Internal medicine

## 2018-10-12 DIAGNOSIS — G809 Cerebral palsy, unspecified: Secondary | ICD-10-CM | POA: Insufficient documentation

## 2018-10-21 ENCOUNTER — Other Ambulatory Visit: Payer: Self-pay

## 2018-10-21 MED ORDER — CETIRIZINE HCL 10 MG PO TABS: 10.0000 mg | ORAL_TABLET | Freq: Every day | ORAL | 5 refills | Status: DC

## 2018-11-05 ENCOUNTER — Other Ambulatory Visit: Payer: Self-pay

## 2018-11-05 DIAGNOSIS — E039 Hypothyroidism, unspecified: Secondary | ICD-10-CM

## 2018-11-05 MED ORDER — FLUTICASONE-SALMETEROL 100-50 MCG/DOSE IN AEPB: 1.0000 | INHALATION_SPRAY | Freq: Two times a day (BID) | RESPIRATORY_TRACT | 5 refills | Status: DC

## 2018-11-05 MED ORDER — PROAIR HFA 108 (90 BASE) MCG/ACT IN AERS: 2.0000 | INHALATION_SPRAY | Freq: Four times a day (QID) | RESPIRATORY_TRACT | 5 refills | Status: DC | PRN

## 2018-11-05 MED ORDER — LEVOTHYROXINE SODIUM 50 MCG PO TABS: 50.0000 ug | ORAL_TABLET | Freq: Every day | ORAL | 5 refills | Status: DC

## 2018-11-05 MED ORDER — MONTELUKAST SODIUM 10 MG PO TABS: 10.0000 mg | ORAL_TABLET | Freq: Every day | ORAL | 5 refills | Status: DC

## 2018-11-12 ENCOUNTER — Other Ambulatory Visit: Payer: Self-pay

## 2018-11-12 DIAGNOSIS — E039 Hypothyroidism, unspecified: Secondary | ICD-10-CM

## 2018-11-12 MED ORDER — FLUTICASONE-SALMETEROL 100-50 MCG/DOSE IN AEPB: 1.0000 | INHALATION_SPRAY | Freq: Two times a day (BID) | RESPIRATORY_TRACT | 5 refills | Status: DC

## 2018-11-12 MED ORDER — LEVOTHYROXINE SODIUM 50 MCG PO TABS: 50.0000 ug | ORAL_TABLET | Freq: Every day | ORAL | 5 refills | Status: DC

## 2018-11-18 ENCOUNTER — Telehealth: Payer: Self-pay | Admitting: Podiatry

## 2018-11-18 NOTE — Telephone Encounter (Signed)
Pt called asking if she could get another pair of inserts like she got last yr.  After checking she got powersteps and I told pt we had them in the office and she could just stop in and pick them up. She then proceed to tell me they had some pad in them that helps with her balance. That she has a history of balance issues but uses her walker outside when she is walking, Which she does often.  I recommended pt to purchase a pair of the powersteps and schedule an appt with Liliane Channel on a Wednesday to see if he can make any adjustments if needed.

## 2019-01-07 ENCOUNTER — Other Ambulatory Visit: Payer: Self-pay

## 2019-01-07 ENCOUNTER — Ambulatory Visit: Payer: Self-pay | Admitting: Orthotics

## 2019-01-07 DIAGNOSIS — R2689 Other abnormalities of gait and mobility: Secondary | ICD-10-CM

## 2019-01-07 DIAGNOSIS — M2170 Unequal limb length (acquired), unspecified site: Secondary | ICD-10-CM

## 2019-01-07 NOTE — Progress Notes (Signed)
Purchased powersteps.

## 2019-01-30 ENCOUNTER — Telehealth: Payer: Self-pay

## 2019-01-30 NOTE — Telephone Encounter (Signed)
NO VM OR ANSWER WAS UNABLE TO CONFIRM OR SCREEN FOR COVID FOR 02-03-19 OV.

## 2019-02-03 ENCOUNTER — Encounter: Payer: Self-pay | Admitting: Nurse Practitioner

## 2019-02-03 ENCOUNTER — Ambulatory Visit (INDEPENDENT_AMBULATORY_CARE_PROVIDER_SITE_OTHER): Payer: PPO | Admitting: Nurse Practitioner

## 2019-02-03 ENCOUNTER — Other Ambulatory Visit: Payer: Self-pay

## 2019-02-03 VITALS — BP 134/85 | HR 84 | Temp 98.2°F | Resp 16 | Ht 61.0 in | Wt 118.4 lb

## 2019-02-03 DIAGNOSIS — E039 Hypothyroidism, unspecified: Secondary | ICD-10-CM

## 2019-02-03 DIAGNOSIS — J45909 Unspecified asthma, uncomplicated: Secondary | ICD-10-CM | POA: Diagnosis not present

## 2019-02-03 DIAGNOSIS — G809 Cerebral palsy, unspecified: Secondary | ICD-10-CM

## 2019-02-03 MED ORDER — FLUTICASONE-SALMETEROL 100-50 MCG/DOSE IN AEPB: 1.0000 | INHALATION_SPRAY | Freq: Two times a day (BID) | RESPIRATORY_TRACT | 5 refills | Status: DC

## 2019-02-03 NOTE — Progress Notes (Signed)
[facility] [address] [place], [place] [address]  Internal MEDICINE  Office Visit Note  Patient Name: Karen Elliott  [id number]  [id number]  Date of Service: 02/15/2019  Chief Complaint  Patient presents with  . Hypothyroidism  . Arthritis    takes antihistamines  in the morning and once at night for knee because of the cold weather   . Asthma    would like to know if she can go to the grocery store if she is careful and wear a mask and washes her hands  . Allergies    certain smells for shampoo make her nose run and would like to know if there is anything she can use to prevent that     The patient is here for routine follow up. She states that she does have some arthritis. Did take antihistamine a few times, as she could not find tylenol for this. Did not help knee pain but did help with nasal congestion. She does have asthma. She is concerned about going to the grocery store. Would like to go on her own if she wears a mask and is careful with hand hygiene. I believe this would be good as long as she continues to wear a mask and wash her hands frequently.       Current Medication: Outpatient Encounter Medications as of 02/03/2019  Medication Sig  . cetirizine (ZYRTEC) 10 MG tablet Take 1 tablet (10 mg total) by mouth daily.  . Fluticasone-Salmeterol (ADVAIR DISKUS) 100-50 MCG/DOSE AEPB Inhale 1 puff into the lungs 2 (two) times daily.  Marland Kitchen GLUCOSAMINE-CALCIUM-VIT D PO Take by mouth.  . levothyroxine (SYNTHROID) 50 MCG tablet Take 1 tablet (50 mcg total) by mouth daily before breakfast.  . montelukast (SINGULAIR) 10 MG tablet Take 1 tablet (10 mg total) by mouth daily.  . Multiple Vitamin (MULTIVITAMIN) capsule Take 1 capsule by mouth daily.  Marland Kitchen PROAIR HFA 108 (90 Base) MCG/ACT inhaler Inhale 2 puffs into the lungs every 6 (six) hours as needed for wheezing or shortness of breath.  . [DISCONTINUED] Fluticasone-Salmeterol (ADVAIR DISKUS) 100-50 MCG/DOSE AEPB Inhale  1 puff into the lungs 2 (two) times daily.   No facility-administered encounter medications on file as of 02/03/2019.     Surgical History: History reviewed. No pertinent surgical history.  Medical History: Past Medical History:  Diagnosis Date  . Allergy   . Asthma   . Cerebral palsy (HCC)   . Hypothyroidism     Family History: Family History  Problem Relation Age of Onset  . Asthma Mother   . Thyroid cancer Mother   . Bladder Cancer Father   . Asthma Sister   . Asthma Brother   . GER disease Brother     Social History   Socioeconomic History  . Marital status: Single    Spouse name: Not on file  . Number of children: Not on file  . Years of education: Not on file  . Highest education level: Not on file  Occupational History  . Not on file  Social Needs  . Financial resource strain: Not on file  . Food insecurity    Worry: Not on file    Inability: Not on file  . Transportation needs    Medical: Not on file    Non-medical: Not on file  Tobacco Use  . Smoking status: Never Smoker  . Smokeless tobacco: Never Used  Substance and Sexual Activity  . Alcohol use: Never    Frequency: Never  .  Drug use: Never  . Sexual activity: Not on file  Lifestyle  . Physical activity    Days per week: Not on file    Minutes per session: Not on file  . Stress: Not on file  Relationships  . Social Musicianconnections    Talks on phone: Not on file    Gets together: Not on file    Attends religious service: Not on file    Active member of club or organization: Not on file    Attends meetings of clubs or organizations: Not on file    Relationship status: Not on file  . Intimate partner violence    Fear of current or ex partner: Not on file    Emotionally abused: Not on file    Physically abused: Not on file    Forced sexual activity: Not on file  Other Topics Concern  . Not on file  Social History Narrative  . Not on file      Review of Systems  Constitutional:  Negative for activity change, chills, fatigue and unexpected weight change.  HENT: Positive for congestion and postnasal drip. Negative for rhinorrhea, sneezing and sore throat.   Respiratory: Positive for wheezing. Negative for cough, chest tightness and shortness of breath.        Intermittent  Cardiovascular: Negative for chest pain and palpitations.  Gastrointestinal: Negative for abdominal distention, abdominal pain, constipation, diarrhea, nausea and vomiting.  Endocrine: Negative for cold intolerance, heat intolerance, polydipsia and polyuria.       Well controlled thyroid disease.   Musculoskeletal: Positive for gait problem. Negative for arthralgias, back pain, joint swelling and neck pain.       Had new lift made for her left shoe. Is doing better with her gait since she got this. Is using a rolling walker while not at home to help with her balance.   Skin: Negative for rash.  Allergic/Immunologic: Positive for environmental allergies.  Neurological: Negative for tremors, weakness, numbness and headaches.  Hematological: Negative for adenopathy. Does not bruise/bleed easily.  Psychiatric/Behavioral: Negative for behavioral problems (Depression), sleep disturbance and suicidal ideas. The patient is nervous/anxious.     Today's Vitals   02/03/19 1109  BP: 134/85  Pulse: 84  Resp: 16  Temp: 98.2 F (36.8 C)  SpO2: 98%  Weight: 118 lb 6.4 oz (53.7 kg)  Height: 5\' 1"  (1.549 m)   Body mass index is 22.37 kg/m.  Physical Exam Vitals signs and nursing note reviewed.  Constitutional:      General: She is not in acute distress.    Appearance: Normal appearance. She is well-developed. She is not diaphoretic.  HENT:     Head: Normocephalic and atraumatic.     Nose: Nose normal.     Mouth/Throat:     Pharynx: No oropharyngeal exudate.  Eyes:     Extraocular Movements: Extraocular movements intact.     Pupils: Pupils are equal, round, and reactive to light.  Neck:      Musculoskeletal: Normal range of motion and neck supple.     Thyroid: No thyromegaly.     Vascular: No JVD.     Trachea: No tracheal deviation.  Cardiovascular:     Rate and Rhythm: Normal rate and regular rhythm.     Heart sounds: Normal heart sounds. No murmur. No friction rub. No gallop.   Pulmonary:     Effort: Pulmonary effort is normal. No respiratory distress.     Breath sounds: Normal breath sounds. No wheezing or  rales.  Chest:     Chest wall: No tenderness.  Abdominal:     Palpations: Abdomen is soft.  Musculoskeletal: Normal range of motion.  Lymphadenopathy:     Cervical: No cervical adenopathy.  Skin:    General: Skin is warm and dry.  Neurological:     Mental Status: She is alert and oriented to person, place, and time. Mental status is at baseline.     Cranial Nerves: No cranial nerve deficit.  Psychiatric:        Mood and Affect: Mood normal.        Behavior: Behavior normal.        Thought Content: Thought content normal.        Judgment: Judgment normal.    Assessment/Plan: 1. Moderate asthma without complication, unspecified whether persistent Continue to use advair diskus twice daily. May use rescue inhaler as needed and as prescribed  - Fluticasone-Salmeterol (ADVAIR DISKUS) 100-50 MCG/DOSE AEPB; Inhale 1 puff into the lungs 2 (two) times daily.  Dispense: 60 each; Refill: 5  2. Acquired hypothyroidism Stable. Continue levothyroxine as prescribed   3. Cerebral palsy, unspecified type ([place]) Stable.   General Counseling: Karen Elliott verbalizes understanding of the findings of todays visit and agrees with plan of treatment. I have discussed any further diagnostic evaluation that may be needed or ordered today. We also reviewed her medications today. she has been encouraged to call the office with any questions or concerns that should arise related to todays visit.   This patient was seen by [place] with Dr Lavera Guise as a part of  collaborative care agreement  Meds ordered this encounter  Medications  . Fluticasone-Salmeterol (ADVAIR DISKUS) 100-50 MCG/DOSE AEPB    Sig: Inhale 1 puff into the lungs 2 (two) times daily.    Dispense:  60 each    Refill:  5    Order Specific Question:   Supervising Provider    Answer:   Lavera Guise [[id number]]    Time spent: 37 Minutes      Dr Lavera Guise Internal medicine

## 2019-04-22 ENCOUNTER — Other Ambulatory Visit: Payer: Self-pay

## 2019-04-22 MED ORDER — CETIRIZINE HCL 10 MG PO TABS: 10.0000 mg | ORAL_TABLET | Freq: Every day | ORAL | 5 refills | Status: DC

## 2019-05-18 ENCOUNTER — Other Ambulatory Visit: Payer: Self-pay

## 2019-05-18 MED ORDER — MONTELUKAST SODIUM 10 MG PO TABS: 10.0000 mg | ORAL_TABLET | Freq: Every day | ORAL | 5 refills | Status: DC

## 2019-06-04 ENCOUNTER — Ambulatory Visit: Payer: PPO | Admitting: Nurse Practitioner

## 2019-06-08 ENCOUNTER — Ambulatory Visit: Payer: PPO | Admitting: Nurse Practitioner

## 2019-06-17 ENCOUNTER — Telehealth: Payer: Self-pay

## 2019-06-17 NOTE — Telephone Encounter (Signed)
No vm unable to lmom for 06-22-19 ov.

## 2019-06-22 ENCOUNTER — Ambulatory Visit (INDEPENDENT_AMBULATORY_CARE_PROVIDER_SITE_OTHER): Payer: PPO | Admitting: Nurse Practitioner

## 2019-06-22 ENCOUNTER — Encounter: Payer: Self-pay | Admitting: Nurse Practitioner

## 2019-06-22 ENCOUNTER — Other Ambulatory Visit: Payer: Self-pay

## 2019-06-22 VITALS — BP 123/77 | HR 102 | Temp 97.3°F | Resp 16 | Ht 61.0 in | Wt 116.0 lb

## 2019-06-22 DIAGNOSIS — Z13820 Encounter for screening for osteoporosis: Secondary | ICD-10-CM

## 2019-06-22 DIAGNOSIS — G809 Cerebral palsy, unspecified: Secondary | ICD-10-CM | POA: Diagnosis not present

## 2019-06-22 DIAGNOSIS — Z1231 Encounter for screening mammogram for malignant neoplasm of breast: Secondary | ICD-10-CM

## 2019-06-22 DIAGNOSIS — E039 Hypothyroidism, unspecified: Secondary | ICD-10-CM

## 2019-06-22 DIAGNOSIS — J45909 Unspecified asthma, uncomplicated: Secondary | ICD-10-CM

## 2019-06-22 NOTE — Progress Notes (Signed)
[facility] [address] [place], [place] [address]  Internal MEDICINE  Office Visit Note  Patient Name: Karen Elliott  [id number]  [id number]  Date of Service: 06/22/2019  Chief Complaint  Patient presents with  . Hypothyroidism  . Asthma    The patient is here for follow up visit. She states that physically, she is doing well. Her father passed away in 2022/06/08. She and family members on in the process of planning memorial service. Has to schedule appointments for screening mammogram and bone density. She is due to have routine fasting labs done.       Current Medication: Outpatient Encounter Medications as of 06/22/2019  Medication Sig  . cetirizine (ZYRTEC) 10 MG tablet Take 1 tablet (10 mg total) by mouth daily.  . Fluticasone-Salmeterol (ADVAIR DISKUS) 100-50 MCG/DOSE AEPB Inhale 1 puff into the lungs 2 (two) times daily.  Marland Kitchen GLUCOSAMINE-CALCIUM-VIT D PO Take by mouth.  . levothyroxine (SYNTHROID) 50 MCG tablet Take 1 tablet (50 mcg total) by mouth daily before breakfast.  . montelukast (SINGULAIR) 10 MG tablet Take 1 tablet (10 mg total) by mouth daily.  . Multiple Vitamin (MULTIVITAMIN) capsule Take 1 capsule by mouth daily.  Marland Kitchen PROAIR HFA 108 (90 Base) MCG/ACT inhaler Inhale 2 puffs into the lungs every 6 (six) hours as needed for wheezing or shortness of breath.   No facility-administered encounter medications on file as of 06/22/2019.    Surgical History: History reviewed. No pertinent surgical history.  Medical History: Past Medical History:  Diagnosis Date  . Allergy   . Asthma   . Cerebral palsy (HCC)   . Hypothyroidism     Family History: Family History  Problem Relation Age of Onset  . Asthma Mother   . Thyroid cancer Mother   . Bladder Cancer Father   . Asthma Sister   . Asthma Brother   . GER disease Brother     Social History   Socioeconomic History  . Marital status: Single    Spouse name: Not on file  . Number of children: Not on  file  . Years of education: Not on file  . Highest education level: Not on file  Occupational History  . Not on file  Tobacco Use  . Smoking status: Never Smoker  . Smokeless tobacco: Never Used  Substance and Sexual Activity  . Alcohol use: Never  . Drug use: Never  . Sexual activity: Not on file  Other Topics Concern  . Not on file  Social History Narrative  . Not on file   Social Determinants of Health   Financial Resource Strain:   . Difficulty of Paying Living Expenses:   Food Insecurity:   . Worried About Programme researcher, broadcasting/film/video in the Last Year:   . Barista in the Last Year:   Transportation Needs:   . Freight forwarder (Medical):   Marland Kitchen Lack of Transportation (Non-Medical):   Physical Activity:   . Days of Exercise per Week:   . Minutes of Exercise per Session:   Stress:   . Feeling of Stress :   Social Connections:   . Frequency of Communication with Friends and Family:   . Frequency of Social Gatherings with Friends and Family:   . Attends Religious Services:   . Active Member of Clubs or Organizations:   . Attends Banker Meetings:   Marland Kitchen Marital Status:   Intimate Partner Violence:   . Fear of Current or Ex-Partner:   .  Emotionally Abused:   Marland Kitchen Physically Abused:   . Sexually Abused:       Review of Systems  Constitutional: Negative for activity change, chills, fatigue and unexpected weight change.  HENT: Negative for congestion, postnasal drip, rhinorrhea, sneezing and sore throat.   Respiratory: Positive for wheezing. Negative for cough, chest tightness and shortness of breath.        Intermittent  Cardiovascular: Negative for chest pain and palpitations.  Gastrointestinal: Negative for abdominal distention, abdominal pain, constipation, diarrhea, nausea and vomiting.  Endocrine: Negative for cold intolerance, heat intolerance, polydipsia and polyuria.       Due to have check of thyroid panel.   Musculoskeletal: Positive for gait  problem. Negative for arthralgias, back pain, joint swelling and neck pain.       Had new lift made for her left shoe. Is doing better with her gait since she got this. Is using a rolling walker while not at home to help with her balance.   Skin: Negative for rash.  Allergic/Immunologic: Positive for environmental allergies.  Neurological: Negative for tremors, weakness, numbness and headaches.  Hematological: Negative for adenopathy. Does not bruise/bleed easily.  Psychiatric/Behavioral: Negative for behavioral problems (Depression), sleep disturbance and suicidal ideas. The patient is nervous/anxious.     Today's Vitals   06/22/19 1511  BP: 123/77  Pulse: (!) 102  Resp: 16  Temp: (!) 97.3 F (36.3 C)  SpO2: 98%  Weight: 116 lb (52.6 kg)  Height: 5\' 1"  (1.549 m)   Body mass index is 21.92 kg/m.  Physical Exam Vitals and nursing note reviewed.  Constitutional:      General: She is not in acute distress.    Appearance: Normal appearance. She is well-developed. She is not diaphoretic.  HENT:     Head: Normocephalic and atraumatic.     Mouth/Throat:     Pharynx: No oropharyngeal exudate.  Eyes:     Pupils: Pupils are equal, round, and reactive to light.  Neck:     Thyroid: No thyromegaly.     Vascular: No JVD.     Trachea: No tracheal deviation.  Cardiovascular:     Rate and Rhythm: Normal rate and regular rhythm.     Heart sounds: Normal heart sounds. No murmur. No friction rub. No gallop.   Pulmonary:     Effort: Pulmonary effort is normal. No respiratory distress.     Breath sounds: Normal breath sounds. No wheezing or rales.  Chest:     Chest wall: No tenderness.  Abdominal:     Palpations: Abdomen is soft.  Musculoskeletal:        General: Normal range of motion.     Cervical back: Normal range of motion and neck supple.  Lymphadenopathy:     Cervical: No cervical adenopathy.  Skin:    General: Skin is warm and dry.  Neurological:     Mental Status: She is  alert and oriented to person, place, and time. Mental status is at baseline.     Cranial Nerves: No cranial nerve deficit.  Psychiatric:        Behavior: Behavior normal.        Thought Content: Thought content normal.        Judgment: Judgment normal.   Assessment/Plan: 1. Acquired hypothyroidism Check thyroid panel and adjust levothyroxine as indicated.   2. Moderate asthma without complication, unspecified whether persistent Stable. Continue inhalers and respiratory medications as prescribed   3. Cerebral palsy, unspecified type (HCC) Stable.   4.  Encounter for screening mammogram for malignant neoplasm of breast - MM DIGITAL SCREENING BILATERAL; Future  5. Screening for osteoporosis - DG Bone Density; Future  General Counseling: Rebekkah verbalizes understanding of the findings of todays visit and agrees with plan of treatment. I have discussed any further diagnostic evaluation that may be needed or ordered today. We also reviewed her medications today. she has been encouraged to call the office with any questions or concerns that should arise related to todays visit.  This patient was seen by Leretha Pol FNP Collaboration with Dr Lavera Guise as a part of collaborative care agreement  Orders Placed This Encounter  Procedures  . DG Bone Density  . MM DIGITAL SCREENING BILATERAL      Total time spent: 30 Minutes   Time spent includes review of chart, medications, test results, and follow up plan with the patient.      Dr Lavera Guise Internal medicine

## 2019-07-20 ENCOUNTER — Other Ambulatory Visit: Payer: Self-pay | Admitting: Nurse Practitioner

## 2019-07-20 DIAGNOSIS — Z1231 Encounter for screening mammogram for malignant neoplasm of breast: Secondary | ICD-10-CM

## 2019-07-29 ENCOUNTER — Other Ambulatory Visit: Payer: Self-pay

## 2019-08-06 ENCOUNTER — Ambulatory Visit
Admission: RE | Admit: 2019-08-06 | Discharge: 2019-08-06 | Disposition: A | Discharge status: Home / Self Care | Payer: PPO | Source: Ambulatory Visit | Attending: Nurse Practitioner | Admitting: Nurse Practitioner

## 2019-08-06 DIAGNOSIS — M810 Age-related osteoporosis without current pathological fracture: Secondary | ICD-10-CM | POA: Diagnosis not present

## 2019-08-06 DIAGNOSIS — Z780 Asymptomatic menopausal state: Secondary | ICD-10-CM | POA: Insufficient documentation

## 2019-08-06 DIAGNOSIS — Z13820 Encounter for screening for osteoporosis: Secondary | ICD-10-CM | POA: Diagnosis not present

## 2019-08-06 DIAGNOSIS — Z1231 Encounter for screening mammogram for malignant neoplasm of breast: Secondary | ICD-10-CM | POA: Insufficient documentation

## 2019-08-19 NOTE — Progress Notes (Signed)
Osteoporosis present on BMD. Will need to start bisphosphonate. Discuss at next visit 10/05/2019

## 2019-08-19 NOTE — Progress Notes (Signed)
Negative mammogram

## 2019-09-03 ENCOUNTER — Other Ambulatory Visit: Payer: Self-pay | Admitting: Nurse Practitioner

## 2019-09-03 ENCOUNTER — Telehealth: Payer: Self-pay | Admitting: Podiatry

## 2019-09-03 DIAGNOSIS — E559 Vitamin D deficiency, unspecified: Secondary | ICD-10-CM | POA: Diagnosis not present

## 2019-09-03 DIAGNOSIS — Z0001 Encounter for general adult medical examination with abnormal findings: Secondary | ICD-10-CM | POA: Diagnosis not present

## 2019-09-03 DIAGNOSIS — E039 Hypothyroidism, unspecified: Secondary | ICD-10-CM | POA: Diagnosis not present

## 2019-09-03 DIAGNOSIS — E782 Mixed hyperlipidemia: Secondary | ICD-10-CM | POA: Diagnosis not present

## 2019-09-03 NOTE — Telephone Encounter (Signed)
Pt called asking if we could get her a pair of shoes because the ones she got are starting to wear down. She will need a rx to get a lift put on due to her having cerebral palsy. I explained that shoes are only covered if diabetic and she is not a diabetic. I did tell pt I would submit a prior auth next week to see if [employer] would cover them but I do not think they will.

## 2019-09-04 LAB — CBC
Hematocrit: 39.5 % (ref 34.0–46.6)
Hemoglobin: 13.4 g/dL (ref 11.1–15.9)
MCH: 29.4 pg (ref 26.6–33.0)
MCHC: 33.9 g/dL (ref 31.5–35.7)
MCV: 87 fL (ref 79–97)
Platelets: 261 10*3/uL (ref 150–450)
RBC: 4.56 x10E6/uL (ref 3.77–5.28)
RDW: 12.6 % (ref 11.7–15.4)
WBC: 4.8 10*3/uL (ref 3.4–10.8)

## 2019-09-04 LAB — COMPREHENSIVE METABOLIC PANEL
ALT: 33 IU/L — ABNORMAL HIGH (ref 0–32)
AST: 34 IU/L (ref 0–40)
Albumin/Globulin Ratio: 2 (ref 1.2–2.2)
Albumin: 4.7 g/dL (ref 3.8–4.9)
Alkaline Phosphatase: 151 IU/L — ABNORMAL HIGH (ref 48–121)
BUN/Creatinine Ratio: 15 (ref 9–23)
BUN: 9 mg/dL (ref 6–24)
Bilirubin Total: 0.2 mg/dL (ref 0.0–1.2)
CO2: 27 mmol/L (ref 20–29)
Calcium: 9.7 mg/dL (ref 8.7–10.2)
Chloride: 101 mmol/L (ref 96–106)
Creatinine, Ser: 0.61 mg/dL (ref 0.57–1.00)
GFR calc Af Amer: 120 mL/min/{1.73_m2} (ref >59)
GFR calc non Af Amer: 104 mL/min/{1.73_m2} (ref >59)
Globulin, Total: 2.3 g/dL (ref 1.5–4.5)
Glucose: 96 mg/dL (ref 65–99)
Potassium: 4.1 mmol/L (ref 3.5–5.2)
Sodium: 142 mmol/L (ref 134–144)
Total Protein: 7 g/dL (ref 6.0–8.5)

## 2019-09-04 LAB — T4, FREE: Free T4: 1.15 ng/dL (ref 0.82–1.77)

## 2019-09-04 LAB — LIPID PANEL W/O CHOL/HDL RATIO
Cholesterol, Total: 241 mg/dL — ABNORMAL HIGH (ref 100–199)
HDL: 88 mg/dL (ref >39)
LDL Chol Calc (NIH): 139 mg/dL — ABNORMAL HIGH (ref 0–99)
Triglycerides: 81 mg/dL (ref 0–149)
VLDL Cholesterol Cal: 14 mg/dL (ref 5–40)

## 2019-09-04 LAB — TSH: TSH: 5.3 u[IU]/mL — ABNORMAL HIGH (ref 0.450–4.500)

## 2019-09-04 LAB — VITAMIN D 25 HYDROXY (VIT D DEFICIENCY, FRACTURES): Vit D, 25-Hydroxy: 30.9 ng/mL (ref 30.0–100.0)

## 2019-09-16 NOTE — Telephone Encounter (Signed)
Received call from [employer] (Debbie) and shoes were denied since pt is not diabetic and they will send letter to patient.

## 2019-10-01 ENCOUNTER — Telehealth: Payer: Self-pay

## 2019-10-01 NOTE — Telephone Encounter (Signed)
No vm unable to lmom to confirm for 10-06-19 ov.

## 2019-10-05 ENCOUNTER — Telehealth: Payer: Self-pay

## 2019-10-05 ENCOUNTER — Ambulatory Visit: Payer: PPO | Admitting: Nurse Practitioner

## 2019-10-05 NOTE — Telephone Encounter (Signed)
Confirmed appointment on 10/06/2019 and screened for covid. [facility] 

## 2019-10-06 ENCOUNTER — Other Ambulatory Visit: Payer: Self-pay

## 2019-10-06 ENCOUNTER — Encounter: Payer: Self-pay | Admitting: Nurse Practitioner

## 2019-10-06 ENCOUNTER — Ambulatory Visit (INDEPENDENT_AMBULATORY_CARE_PROVIDER_SITE_OTHER): Payer: PPO | Admitting: Nurse Practitioner

## 2019-10-06 VITALS — BP 116/74 | HR 97 | Temp 97.3°F | Resp 16 | Ht 62.0 in | Wt 117.8 lb

## 2019-10-06 DIAGNOSIS — R300 Dysuria: Secondary | ICD-10-CM

## 2019-10-06 DIAGNOSIS — Z0001 Encounter for general adult medical examination with abnormal findings: Secondary | ICD-10-CM | POA: Diagnosis not present

## 2019-10-06 DIAGNOSIS — R269 Unspecified abnormalities of gait and mobility: Secondary | ICD-10-CM | POA: Diagnosis not present

## 2019-10-06 DIAGNOSIS — J45909 Unspecified asthma, uncomplicated: Secondary | ICD-10-CM

## 2019-10-06 DIAGNOSIS — E039 Hypothyroidism, unspecified: Secondary | ICD-10-CM

## 2019-10-06 DIAGNOSIS — M810 Age-related osteoporosis without current pathological fracture: Secondary | ICD-10-CM | POA: Diagnosis not present

## 2019-10-06 DIAGNOSIS — G809 Cerebral palsy, unspecified: Secondary | ICD-10-CM

## 2019-10-06 MED ORDER — ALENDRONATE SODIUM 35 MG PO TABS: 35.0000 mg | ORAL_TABLET | ORAL | 3 refills | Status: DC

## 2019-10-06 MED ORDER — LEVOTHYROXINE SODIUM 75 MCG PO TABS: 75.0000 ug | ORAL_TABLET | Freq: Every day | ORAL | 3 refills | Status: DC

## 2019-10-06 NOTE — Progress Notes (Signed)
[facility] [place], [place] [address]  Internal MEDICINE  Office Visit Note  Patient Name: Karen Elliott  [id number]  [id number]  Date of Service: 10/07/2019   Pt is here for routine health maintenance examination   Chief Complaint  Patient presents with  . Medicare Wellness     The patient is here for health maintenance exam.  -Patient has seen him in the past due to need for orthotics in her shoe. He has had lift made for her shoe as legs are uneven because of cerebral palsy. Needs new orthoptics and was denied by [facility] as she is not diabetic. However, this is not why she needs to have orthotic lift, not diabetic shoes.  -normal mammogram -bmd showing osteoporosis in bilateral femur with osteopenia in lumbar spine.  - recent labs indicate hypothyroid. Currently on levothyroxine at 26mg daily.  -mild elevation of LDL and total cholesterol.  -well managed asthma. Avoids triggers and uses inhalers as prescribed   Current Medication: Outpatient Encounter Medications as of 10/06/2019  Medication Sig  . cetirizine (ZYRTEC) 10 MG tablet Take 1 tablet (10 mg total) by mouth daily.  . Fluticasone-Salmeterol (ADVAIR DISKUS) 100-50 MCG/DOSE AEPB Inhale 1 puff into the lungs 2 (two) times daily.  .Marland KitchenGLUCOSAMINE-CALCIUM-VIT D PO Take by mouth.  . levothyroxine (SYNTHROID) 75 MCG tablet Take 1 tablet (75 mcg total) by mouth daily before breakfast.  . montelukast (SINGULAIR) 10 MG tablet Take 1 tablet (10 mg total) by mouth daily.  . Multiple Vitamin (MULTIVITAMIN) capsule Take 1 capsule by mouth daily.  .Marland KitchenPROAIR HFA 108 (90 Base) MCG/ACT inhaler Inhale 2 puffs into the lungs every 6 (six) hours as needed for wheezing or shortness of breath.  . [DISCONTINUED] levothyroxine (SYNTHROID) 50 MCG tablet Take 1 tablet (50 mcg total) by mouth daily before breakfast.  . alendronate (FOSAMAX) 35 MG tablet Take 1 tablet (35 mg total) by mouth every 7 (seven) days. Take with a  full glass of water on an empty stomach.   No facility-administered encounter medications on file as of 10/06/2019.    Surgical History: History reviewed. No pertinent surgical history.  Medical History: Past Medical History:  Diagnosis Date  . Allergy   . Asthma   . C[place]bral [place]y [place]   . Hypothyroidism     Family History: Family History  Problem Relation Age of Onset  . Asthma Mother   . Thyroid cancer Mother   . Bladder Cancer Father   . Asthma Sister   . Asthma Brother   . GER disease Brother       Review of Systems  Constitutional: Negative for activity change, chills, fatigue and unexpected weight change.  HENT: Negative for congestion, postnasal drip, rhinorrhea, sneezing and sore throat.   Respiratory: Positive for wheezing. Negative for cough, chest tightness and shortness of breath.        Intermittent  Cardiovascular: Negative for chest pain and palpitations.  Gastrointestinal: Negative for abdominal distention, abdominal pain, constipation, diarrhea, nausea and vomiting.  Endocrine: Negative for cold intolerance, heat intolerance, polydipsia and polyuria.       Mild hypothyroid present on labs  Genitourinary: Negative for dysuria, frequency, hematuria and urgency.  Musculoskeletal: Positive for gait problem. Negative for arthralgias, back pain, joint swelling and neck pain.       Needs new lift made for her left shoe. Gait is very abnormal due to cerebral palsy. Has had lifts/orthotics made for her in the past, but has been  quite a while. With her left leg being shorter than the right, she has increasing gait instability with left knee and hip pain.   Skin: Negative for rash.  Allergic/Immunologic: Positive for environmental allergies.  Neurological: Negative for tremors, weakness, numbness and headaches.  Hematological: Negative for adenopathy. Does not bruise/bleed easily.  Psychiatric/Behavioral: Negative for behavioral problems (Depression), sleep  disturbance and suicidal ideas. The patient is nervous/anxious.      Today's Vitals   10/06/19 1006  BP: 116/74  Pulse: 97  Resp: 16  Temp: (!) 97.3 F (36.3 C)  SpO2: 99%  Weight: 117 lb 12.8 oz (53.4 kg)  Height: 5' 2"  (1.575 m)   Body mass index is 21.55 kg/m.  Physical Exam Vitals and nursing note reviewed.  Constitutional:      General: She is not in acute distress.    Appearance: Normal appearance. She is well-developed. She is not diaphoretic.  HENT:     Head: Normocephalic and atraumatic.     Nose: Nose normal.     Mouth/Throat:     Pharynx: No oropharyngeal exudate.  Eyes:     Pupils: Pupils are equal, round, and reactive to light.  Neck:     Thyroid: No thyromegaly.     Vascular: No carotid bruit or JVD.     Trachea: No tracheal deviation.  Cardiovascular:     Rate and Rhythm: Normal rate and regular rhythm.     Pulses: Normal pulses.     Heart sounds: Normal heart sounds. No murmur heard.  No friction rub. No gallop.   Pulmonary:     Effort: Pulmonary effort is normal. No respiratory distress.     Breath sounds: Normal breath sounds. No wheezing or rales.  Chest:     Chest wall: No tenderness.  Abdominal:     General: Bowel sounds are normal.     Palpations: Abdomen is soft.     Tenderness: There is no abdominal tenderness.  Musculoskeletal:        General: Normal range of motion.     Cervical back: Normal range of motion and neck supple.     Comments: Patient walking with very unstable gait. Has strong limp, favoring the left side. Uses a rolling walker to help with ambulation.   Lymphadenopathy:     Cervical: No cervical adenopathy.  Skin:    General: Skin is warm and dry.  Neurological:     Mental Status: She is alert and oriented to person, place, and time. Mental status is at baseline.     Cranial Nerves: No cranial nerve deficit.  Psychiatric:        Mood and Affect: Mood normal.        Behavior: Behavior normal.        Thought Content:  Thought content normal.        Judgment: Judgment normal.    Depression screen [facility] 2/9 10/06/2019 06/22/2019 02/03/2019 10/03/2018 06/17/2017  Decreased Interest 0 0 0 0 0  Down, Depressed, Hopeless 0 0 0 0 0  PHQ - 2 Score 0 0 0 0 0    Functional Status Survey: Is the patient deaf or have difficulty hearing?: No Does the patient have difficulty seeing, even when wearing glasses/contacts?: Yes (needs glasses to read up close) Does the patient have difficulty concentrating, remembering, or making decisions?: No Does the patient have difficulty walking or climbing stairs?: Yes (cerebal palsy) Does the patient have difficulty dressing or bathing?: No Does the patient have difficulty doing errands  alone such as visiting a doctor's office or shopping?: No  MMSE - Bad Axe Exam 10/06/2019 10/06/2019 10/03/2018  Orientation to time 5 5 5   Orientation to Place 5 5 5   Registration 3 3 3   Attention/ Calculation 5 5 5   Recall 3 3 3   Language- name 2 objects 2 2 2   Language- repeat 1 1 1   Language- follow 3 step command 3 3 3   Language- read & follow direction 1 1 1   Write a sentence 1 1 1   Copy design 1 1 1   Total score 30 30 30     Fall Risk  10/06/2019 06/22/2019 02/03/2019 10/03/2018 06/17/2017  Falls in the past year? 0 0 0 0 No      LABS: Recent Results (from the past 2160 hour(s))  Comprehensive metabolic panel     Status: Abnormal   Collection Time: 09/03/19 10:06 AM  Result Value Ref Range   Glucose 96 65 - 99 mg/dL   BUN 9 6 - 24 mg/dL   Creatinine, Ser 0.61 0.57 - 1.00 mg/dL   GFR calc non Af Amer 104 >59 mL/min/1.73   GFR calc Af Amer 120 >59 mL/min/1.73    Comment: **[facility] currently reports eGFR in compliance with the current**   recommendations of the [employer]. [facility] will   update reporting as new guidelines are published from the NKF-ASN   Task force.    BUN/Creatinine Ratio 15 9 - 23   Sodium 142 134 - 144 mmol/L   Potassium 4.1 3.5 - 5.2  mmol/L   Chloride 101 96 - 106 mmol/L   CO2 27 20 - 29 mmol/L   Calcium 9.7 8.7 - 10.2 mg/dL   Total Protein 7.0 6.0 - 8.5 g/dL   Albumin 4.7 3.8 - 4.9 g/dL   Globulin, Total 2.3 1.5 - 4.5 g/dL   Albumin/Globulin Ratio 2.0 1.2 - 2.2   Bilirubin Total 0.2 0.0 - 1.2 mg/dL   Alkaline Phosphatase 151 (H) 48 - 121 IU/L   AST 34 0 - 40 IU/L   ALT 33 (H) 0 - 32 IU/L  CBC     Status: None   Collection Time: 09/03/19 10:06 AM  Result Value Ref Range   WBC 4.8 3.4 - 10.8 x10E3/uL   RBC 4.56 3.77 - 5.28 x10E6/uL   Hemoglobin 13.4 11.1 - 15.9 g/dL   Hematocrit 39.5 34.0 - 46.6 %   MCV 87 79 - 97 fL   MCH 29.4 26.6 - 33.0 pg   MCHC 33.9 31 - 35 g/dL   RDW 12.6 11.7 - 15.4 %   Platelets 261 150 - 450 x10E3/uL  Lipid Panel w/o Chol/HDL Ratio     Status: Abnormal   Collection Time: 09/03/19 10:06 AM  Result Value Ref Range   Cholesterol, Total 241 (H) 100 - 199 mg/dL   Triglycerides 81 0 - 149 mg/dL   HDL 88 >39 mg/dL   VLDL Cholesterol Cal 14 5 - 40 mg/dL   LDL Chol Calc (NIH) 139 (H) 0 - 99 mg/dL  T4, free     Status: None   Collection Time: 09/03/19 10:06 AM  Result Value Ref Range   Free T4 1.15 0.82 - 1.77 ng/dL  TSH     Status: Abnormal   Collection Time: 09/03/19 10:06 AM  Result Value Ref Range   TSH 5.300 (H) 0.450 - 4.500 uIU/mL  VITAMIN D 25 Hydroxy (Vit-D Deficiency, Fractures)     Status: None   Collection Time:  09/03/19 10:06 AM  Result Value Ref Range   Vit D, 25-Hydroxy 30.9 30.0 - 100.0 ng/mL    Comment: Vitamin D deficiency has been defined by the [place] practice guideline as a level of serum 25-OH vitamin D less than 20 ng/mL (1,2). The Endocrine Society went on to further define vitamin D insufficiency as a level between 21 and 29 ng/mL (2). 1. IOM (Institute of Medicine). 2010. Dietary reference    intakes for calcium and D. [place]: The    [employer]. 2. Holick MF, Binkley [place], Bischoff-Ferrari HA, et  al.    Evaluation, treatment, and prevention of vitamin D    deficiency: an Endocrine Society clinical practice    guideline. JCEM. 2011 Jul; 96(7):1911-30.   UA/M w/rflx Culture, Routine     Status: Abnormal (Preliminary result)   Collection Time: 10/06/19  4:14 PM   Specimen: Urine   Urine  Result Value Ref Range   Specific Gravity, UA 1.016 1.005 - 1.030   pH, UA 5.5 5.0 - 7.5   Color, UA Yellow Yellow   Appearance Ur Clear Clear   Leukocytes,UA Trace (A) Negative   Protein,UA Negative Negative/Trace   Glucose, UA Negative Negative   Ketones, UA Negative Negative   RBC, UA Negative Negative   Bilirubin, UA Negative Negative   Urobilinogen, Ur 0.2 0.2 - 1.0 mg/dL   Nitrite, UA Negative Negative   Microscopic Examination See below:     Comment: Microscopic was indicated and was performed.   Urinalysis Reflex Comment     Comment: This specimen has reflexed to a Urine Culture.  Microscopic Examination     Status: None   Collection Time: 10/06/19  4:14 PM   Urine  Result Value Ref Range   WBC, UA 0-5 0 - 5 /hpf   RBC 0-2 0 - 2 /hpf   Epithelial Cells (non renal) 0-10 0 - 10 /hpf   Casts None seen None seen /lpf   Bacteria, UA None seen None seen/Few  Urine Culture, Reflex     Status: None (Preliminary result)   Collection Time: 10/06/19  4:14 PM   Urine  Result Value Ref Range   Urine Culture, Routine WILL FOLLOW    Assessment/Plan: 1. Encounter for general adult medical examination with abnormal findings Annual health maintenance exam today.  2. Acquired hypothyroidism Increase levothyroxine to 82mg daily. Recheck thyroid panel in three months and adjust dosing as indicated.  - levothyroxine (SYNTHROID) 75 MCG tablet; Take 1 tablet (75 mcg total) by mouth daily before breakfast.  Dispense: 30 tablet; Refill: 3  3. Moderate asthma without complication, unspecified whether persistent Stable. Continue inhalers and respiratory medication as prescribed  4. Cerebral  palsy, unspecified type ([place] Requires orthotic lefit in left shoe to even out hips and improve gait. Refer to podiatry for further evaluation and treatment.  - Ambulatory referral to Podiatry  5. Abnormal gait Requires orthotic lefit in left shoe to even out hips and improve gait. Refer to podiatry for further evaluation and treatment.  - Ambulatory referral to Podiatry  6. Age-related osteoporosis without current pathological fracture Start fosamax 321mweekly. Recheck BMD in two years.  - alendronate (FOSAMAX) 35 MG tablet; Take 1 tablet (35 mg total) by mouth every 7 (seven) days. Take with a full glass of water on an empty stomach.  Dispense: 12 tablet; Refill: 3  7. Dysuria - UA/M w/rflx Culture, Routine  General Counseling: Bernette verbalizes understanding of  the findings of todays visit and agrees with plan of treatment. I have discussed any further diagnostic evaluation that may be needed or ordered today. We also reviewed her medications today. she has been encouraged to call the office with any questions or concerns that should arise related to todays visit.    Counseling:  This patient was seen by Leretha Pol FNP Collaboration with Dr Lavera Guise as a part of collaborative care agreement  Orders Placed This Encounter  Procedures  . Microscopic Examination  . Urine Culture, Reflex  . UA/M w/rflx Culture, Routine  . Ambulatory referral to [facility] ordered this encounter  Medications  . alendronate (FOSAMAX) 35 MG tablet    Sig: Take 1 tablet (35 mg total) by mouth every 7 (seven) days. Take with a full glass of water on an empty stomach.    Dispense:  12 tablet    Refill:  3    Order Specific Question:   Supervising Provider    Answer:   Lavera Guise [[id number]]  . levothyroxine (SYNTHROID) 75 MCG tablet    Sig: Take 1 tablet (75 mcg total) by mouth daily before breakfast.    Dispense:  30 tablet    Refill:  3    Order Specific Question:   Supervising  Provider    Answer:   Lavera Guise [[id number]]    Total time spent: 43 Minutes  Time spent includes review of chart, medications, test results, and follow up plan with the patient.     Lavera Guise, MD  Internal Medicine

## 2019-10-07 DIAGNOSIS — Z7689 Persons encountering health services in other specified circumstances: Secondary | ICD-10-CM | POA: Insufficient documentation

## 2019-10-07 DIAGNOSIS — J45909 Unspecified asthma, uncomplicated: Secondary | ICD-10-CM | POA: Insufficient documentation

## 2019-10-07 DIAGNOSIS — Z0000 Encounter for general adult medical examination without abnormal findings: Secondary | ICD-10-CM | POA: Insufficient documentation

## 2019-10-07 DIAGNOSIS — Z0001 Encounter for general adult medical examination with abnormal findings: Secondary | ICD-10-CM | POA: Insufficient documentation

## 2019-10-07 DIAGNOSIS — M810 Age-related osteoporosis without current pathological fracture: Secondary | ICD-10-CM | POA: Insufficient documentation

## 2019-10-07 DIAGNOSIS — R269 Unspecified abnormalities of gait and mobility: Secondary | ICD-10-CM | POA: Insufficient documentation

## 2019-10-07 NOTE — Progress Notes (Signed)
Reviewed labs at time of visit. Increased dose levothyroxine.

## 2019-10-08 ENCOUNTER — Telehealth: Payer: Self-pay | Admitting: *Deleted

## 2019-10-08 NOTE — Telephone Encounter (Signed)
"  I need to come in to see Dr. Stacie Acres.  I need some shoes made."  We don't make shoes.  Are you Diabetic?  "No, I'm not Diabetic.  I have Cerebral Palsy."  We only have a Diabetic Program that we use.  I had some done there before."  We gave you prescription to go to [facility] and have a lift added to your shoe.  We also sold you some Powerstep inserts.  "Well my doctor said you all should be able to get me some shoes.  I have to have something soon, I have Cerebral Palsy!  She sent you a referral to see the doctor.  My shoes that I have now are worn out.  I have a hole on the top of my shoe.  Can you send the prescription to [facility]?"  Yes, I can fax a prescription to them.  "What's the phone number for [facility]?"  It's [phone].  "I'll get my sister to get me a pair of shoes.  Once I get them, I'll come by there to get the powersteps."  I'll send a message to [facility] and ask him to write you a prescription for the lift and fax it to [facility].  Karen Elliott, can she not get orthotics with the lift added to the one insert?)

## 2019-10-10 LAB — MICROSCOPIC EXAMINATION
Bacteria, UA: NONE SEEN
Casts: NONE SEEN /lpf

## 2019-10-10 LAB — URINE CULTURE, REFLEX

## 2019-10-10 LAB — UA/M W/RFLX CULTURE, ROUTINE
Bilirubin, UA: NEGATIVE
Glucose, UA: NEGATIVE
Ketones, UA: NEGATIVE
Nitrite, UA: NEGATIVE
Protein,UA: NEGATIVE
RBC, UA: NEGATIVE
Specific Gravity, UA: 1.016 (ref 1.005–1.030)
Urobilinogen, Ur: 0.2 mg/dL (ref 0.2–1.0)
pH, UA: 5.5 (ref 5.0–7.5)

## 2019-10-16 ENCOUNTER — Other Ambulatory Visit: Payer: Self-pay

## 2019-10-16 MED ORDER — CETIRIZINE HCL 10 MG PO TABS: 10.0000 mg | ORAL_TABLET | Freq: Every day | ORAL | 5 refills | Status: DC

## 2019-10-26 ENCOUNTER — Other Ambulatory Visit: Payer: Self-pay

## 2019-10-26 MED ORDER — NITROFURANTOIN MONOHYD MACRO 100 MG PO CAPS: 100.0000 mg | ORAL_CAPSULE | Freq: Two times a day (BID) | ORAL | 0 refills | Status: DC

## 2019-10-27 NOTE — Progress Notes (Signed)
Pt advised  urine did showed infection we send antibiotic and also spoke with phar they are going to call pt and deliver her med at home

## 2019-11-13 ENCOUNTER — Other Ambulatory Visit: Payer: Self-pay

## 2019-11-13 MED ORDER — MONTELUKAST SODIUM 10 MG PO TABS: 10.0000 mg | ORAL_TABLET | Freq: Every day | ORAL | 5 refills | Status: DC

## 2020-01-05 ENCOUNTER — Ambulatory Visit: Payer: PPO | Admitting: Nurse Practitioner

## 2020-02-08 ENCOUNTER — Other Ambulatory Visit: Payer: Self-pay

## 2020-02-08 DIAGNOSIS — E039 Hypothyroidism, unspecified: Secondary | ICD-10-CM

## 2020-02-08 MED ORDER — LEVOTHYROXINE SODIUM 75 MCG PO TABS: 75.0000 ug | ORAL_TABLET | Freq: Every day | ORAL | 3 refills | Status: DC

## 2020-02-17 ENCOUNTER — Other Ambulatory Visit: Payer: Self-pay

## 2020-02-17 DIAGNOSIS — J45909 Unspecified asthma, uncomplicated: Secondary | ICD-10-CM

## 2020-02-17 MED ORDER — FLUTICASONE-SALMETEROL 100-50 MCG/DOSE IN AEPB: 1.0000 | INHALATION_SPRAY | Freq: Two times a day (BID) | RESPIRATORY_TRACT | 5 refills | Status: DC

## 2020-03-31 ENCOUNTER — Other Ambulatory Visit: Payer: Self-pay

## 2020-03-31 MED ORDER — PROAIR HFA 108 (90 BASE) MCG/ACT IN AERS: 2.0000 | INHALATION_SPRAY | Freq: Four times a day (QID) | RESPIRATORY_TRACT | 1 refills | Status: DC | PRN

## 2020-04-20 ENCOUNTER — Other Ambulatory Visit: Payer: Self-pay

## 2020-04-20 MED ORDER — CETIRIZINE HCL 10 MG PO TABS
10.0000 mg | ORAL_TABLET | Freq: Every day | ORAL | 0 refills | Status: DC
Start: 2020-04-20 — End: 2020-06-01

## 2020-05-04 ENCOUNTER — Other Ambulatory Visit: Payer: Self-pay

## 2020-05-04 MED ORDER — MONTELUKAST SODIUM 10 MG PO TABS
10.0000 mg | ORAL_TABLET | Freq: Every day | ORAL | 2 refills | Status: DC
Start: 2020-05-04 — End: 2020-08-25

## 2020-06-01 ENCOUNTER — Other Ambulatory Visit: Payer: Self-pay | Admitting: Internal Medicine

## 2020-06-01 NOTE — Telephone Encounter (Signed)
Pt need appt for refills  ?

## 2020-06-20 ENCOUNTER — Telehealth: Payer: Self-pay | Admitting: Nurse Practitioner

## 2020-06-20 NOTE — Progress Notes (Signed)
  Chronic Care Management   Outreach Note  06/20/2020 Name: LAREE GARRON MRN: [id number] DOB: 1965-04-20  Referred by: Carlean Jews, NP Reason for referral : No chief complaint on file.   An unsuccessful telephone outreach was attempted today. The patient was referred to the pharmacist for assistance with care management and care coordination.   Follow Up Plan:   Carley Perdue [employer] Scheduler

## 2020-07-13 ENCOUNTER — Other Ambulatory Visit: Payer: Self-pay | Admitting: Internal Medicine

## 2020-07-13 ENCOUNTER — Other Ambulatory Visit: Payer: Self-pay | Admitting: Nurse Practitioner

## 2020-07-13 DIAGNOSIS — E039 Hypothyroidism, unspecified: Secondary | ICD-10-CM

## 2020-07-14 ENCOUNTER — Other Ambulatory Visit: Payer: Self-pay | Admitting: Internal Medicine

## 2020-07-14 DIAGNOSIS — E039 Hypothyroidism, unspecified: Secondary | ICD-10-CM

## 2020-07-14 NOTE — Telephone Encounter (Signed)
Pt need appt for next refills

## 2020-08-03 ENCOUNTER — Telehealth: Payer: Self-pay | Admitting: Internal Medicine

## 2020-08-03 NOTE — Progress Notes (Signed)
  Chronic Care Management   Outreach Note  08/03/2020 Name: Karen Elliott MRN: [id number] DOB: [birth year]  Referred by: No primary care provider on file. Reason for referral : No chief complaint on file.   Third unsuccessful telephone outreach was attempted today. The patient was referred to the pharmacist for assistance with care management and care coordination.   Follow Up Plan:   Alvie He[place] [employer] Sched[facility]

## 2020-08-08 ENCOUNTER — Other Ambulatory Visit: Payer: Self-pay | Admitting: Internal Medicine

## 2020-08-25 ENCOUNTER — Other Ambulatory Visit: Payer: Self-pay | Admitting: Internal Medicine

## 2020-08-25 ENCOUNTER — Telehealth: Payer: Self-pay

## 2020-08-25 NOTE — Telephone Encounter (Signed)
Pt need appt for refills  ?

## 2020-08-25 NOTE — Telephone Encounter (Signed)
Spoke with pt  advised to need appt for refills she said she will back and make appt

## 2020-09-27 ENCOUNTER — Other Ambulatory Visit: Payer: Self-pay | Admitting: Nurse Practitioner

## 2020-09-27 ENCOUNTER — Other Ambulatory Visit: Payer: Self-pay | Admitting: Internal Medicine

## 2020-09-27 DIAGNOSIS — J45909 Unspecified asthma, uncomplicated: Secondary | ICD-10-CM

## 2020-09-27 NOTE — Telephone Encounter (Signed)
Pt needs to be scheduled for CPE, Meds will not be refilled

## 2020-09-27 NOTE — Telephone Encounter (Signed)
She scheduled for 11/22/20

## 2020-09-28 ENCOUNTER — Other Ambulatory Visit: Payer: Self-pay | Admitting: Nurse Practitioner

## 2020-09-28 DIAGNOSIS — J45909 Unspecified asthma, uncomplicated: Secondary | ICD-10-CM

## 2020-10-11 ENCOUNTER — Other Ambulatory Visit: Payer: Self-pay | Admitting: Internal Medicine

## 2020-10-11 DIAGNOSIS — J45909 Unspecified asthma, uncomplicated: Secondary | ICD-10-CM

## 2020-10-28 ENCOUNTER — Other Ambulatory Visit: Payer: Self-pay | Admitting: Internal Medicine

## 2020-11-01 ENCOUNTER — Other Ambulatory Visit: Payer: Self-pay | Admitting: Nurse Practitioner

## 2020-11-01 ENCOUNTER — Other Ambulatory Visit: Payer: Self-pay | Admitting: Internal Medicine

## 2020-11-01 DIAGNOSIS — J45909 Unspecified asthma, uncomplicated: Secondary | ICD-10-CM

## 2020-11-01 MED ORDER — FLUTICASONE-SALMETEROL 100-50 MCG/ACT IN AEPB: 1.0000 | INHALATION_SPRAY | Freq: Two times a day (BID) | RESPIRATORY_TRACT | 0 refills | Status: DC

## 2020-11-18 ENCOUNTER — Telehealth: Payer: Self-pay

## 2020-11-18 NOTE — Telephone Encounter (Signed)
Left vm to confirm 11/22/20 appointment-Toni 

## 2020-11-22 ENCOUNTER — Encounter: Payer: PPO | Admitting: Nurse Practitioner

## 2020-12-01 ENCOUNTER — Other Ambulatory Visit: Payer: Self-pay | Admitting: Internal Medicine

## 2020-12-01 DIAGNOSIS — E039 Hypothyroidism, unspecified: Secondary | ICD-10-CM

## 2020-12-29 ENCOUNTER — Encounter: Payer: PPO | Admitting: Nurse Practitioner

## 2021-01-02 ENCOUNTER — Other Ambulatory Visit: Payer: Self-pay | Admitting: Internal Medicine

## 2021-01-02 ENCOUNTER — Encounter: Payer: PPO | Admitting: Nurse Practitioner

## 2021-01-02 DIAGNOSIS — E039 Hypothyroidism, unspecified: Secondary | ICD-10-CM

## 2021-01-03 ENCOUNTER — Encounter: Payer: Self-pay | Admitting: Nurse Practitioner

## 2021-01-03 ENCOUNTER — Other Ambulatory Visit: Payer: Self-pay

## 2021-01-03 ENCOUNTER — Ambulatory Visit (INDEPENDENT_AMBULATORY_CARE_PROVIDER_SITE_OTHER): Payer: PPO | Admitting: Nurse Practitioner

## 2021-01-03 VITALS — BP 120/80 | HR 94 | Temp 97.8°F | Resp 16 | Ht 61.0 in | Wt 116.0 lb

## 2021-01-03 DIAGNOSIS — J45909 Unspecified asthma, uncomplicated: Secondary | ICD-10-CM

## 2021-01-03 DIAGNOSIS — E039 Hypothyroidism, unspecified: Secondary | ICD-10-CM

## 2021-01-03 DIAGNOSIS — M810 Age-related osteoporosis without current pathological fracture: Secondary | ICD-10-CM

## 2021-01-03 MED ORDER — LEVOTHYROXINE SODIUM 75 MCG PO TABS: 75.0000 ug | ORAL_TABLET | Freq: Every day | ORAL | 2 refills | Status: DC

## 2021-01-03 MED ORDER — PROAIR HFA 108 (90 BASE) MCG/ACT IN AERS: 2.0000 | INHALATION_SPRAY | Freq: Four times a day (QID) | RESPIRATORY_TRACT | 3 refills | Status: DC | PRN

## 2021-01-03 MED ORDER — ADVAIR DISKUS 100-50 MCG/ACT IN AEPB: 1.0000 | INHALATION_SPRAY | Freq: Two times a day (BID) | RESPIRATORY_TRACT | 5 refills | Status: DC

## 2021-01-03 MED ORDER — TRIAMCINOLONE ACETONIDE 0.025 % EX CREA: 1.0000 "application " | TOPICAL_CREAM | Freq: Two times a day (BID) | CUTANEOUS | 3 refills | Status: AC

## 2021-01-03 MED ORDER — MONTELUKAST SODIUM 10 MG PO TABS: 10.0000 mg | ORAL_TABLET | Freq: Every day | ORAL | 2 refills | Status: DC

## 2021-01-03 MED ORDER — ALENDRONATE SODIUM 35 MG PO TABS: 35.0000 mg | ORAL_TABLET | ORAL | 3 refills | Status: AC

## 2021-01-03 MED ORDER — CETIRIZINE HCL 10 MG PO TABS: 10.0000 mg | ORAL_TABLET | Freq: Every day | ORAL | 5 refills | Status: DC

## 2021-01-03 NOTE — Progress Notes (Signed)
[facility] [address] [place], [place] [address]  Internal MEDICINE  Office Visit Note  Patient Name: Karen Elliott  [id number]  [id number]  Date of Service: 01/03/2021  Chief Complaint  Patient presents with  . Follow-up  . Hypothyroidism    HPI Karen Elliott presents for a follow up visit for medication refills. She has hypothyroidism, osteoporosis and asthma. She takes levothyroxine for hypothyroidism. She is taking fosamax for osteoporosis. For asthma, she has advair, montelukast and cetirizine and an albuterol inhaler.  She has a raised red plaque on her right forearm that has no scales, is not itchy or painful. It has been there for a few months. She has not tried to put anything on it. There are no other plaques or rashes on the body.    Current Medication: Outpatient Encounter Medications as of 01/03/2021  Medication Sig  . triamcinolone (KENALOG) 0.025 % cream Apply 1 application topically 2 (two) times daily.  Marland Kitchen ADVAIR DISKUS 100-50 MCG/ACT AEPB Inhale 1 puff into the lungs 2 (two) times daily.  Marland Kitchen alendronate (FOSAMAX) 35 MG tablet Take 1 tablet (35 mg total) by mouth every 7 (seven) days. Take with a full glass of water on an empty stomach.  . cetirizine (ZYRTEC) 10 MG tablet Take 1 tablet (10 mg total) by mouth daily.  Marland Kitchen GLUCOSAMINE-CALCIUM-VIT D PO Take by mouth.  . levothyroxine (SYNTHROID) 75 MCG tablet Take 1 tablet (75 mcg total) by mouth daily before breakfast.  . montelukast (SINGULAIR) 10 MG tablet Take 1 tablet (10 mg total) by mouth daily.  . Multiple Vitamin (MULTIVITAMIN) capsule Take 1 capsule by mouth daily.  Marland Kitchen PROAIR HFA 108 (90 Base) MCG/ACT inhaler Inhale 2 puffs into the lungs every 6 (six) hours as needed for wheezing or shortness of breath.  . [DISCONTINUED] ADVAIR DISKUS 100-50 MCG/ACT AEPB INHALE 1 PUFF INTO THE LUNGS 2 TIMES DAILY  . [DISCONTINUED] alendronate (FOSAMAX) 35 MG tablet Take 1 tablet (35 mg total) by mouth every 7 (seven)  days. Take with a full glass of water on an empty stomach.  . [DISCONTINUED] cetirizine (ZYRTEC) 10 MG tablet TAKE 1 TABLET BY MOUTH DAILY  . [DISCONTINUED] levothyroxine (SYNTHROID) 75 MCG tablet TAKE 1 TABLET (75 MCG TOTAL) BY MOUTH DAILY BEFORE BREAKFAST  . [DISCONTINUED] montelukast (SINGULAIR) 10 MG tablet TAKE 1 TABLET BY MOUTH DAILY  . [DISCONTINUED] nitrofurantoin, macrocrystal-monohydrate, (MACROBID) 100 MG capsule Take 1 capsule (100 mg total) by mouth 2 (two) times daily.  . [DISCONTINUED] PROAIR HFA 108 (90 Base) MCG/ACT inhaler INHALE 2 PUFFS BY MOUTH INTO THE LUNGS EVERY 6 HOURS AS NEEDED FOR WHEEZING OR SHORTNESS OF BREATH.   No facility-administered encounter medications on file as of 01/03/2021.    Surgical History: History reviewed. No pertinent surgical history.  Medical History: Past Medical History:  Diagnosis Date  . Allergy   . Asthma   . Cerebral palsy (HCC)   . Hypothyroidism     Family History: Family History  Problem Relation Age of Onset  . Asthma Mother   . Thyroid cancer Mother   . Bladder Cancer Father   . Asthma Sister   . Asthma Brother   . GER disease Brother     Social History   Socioeconomic History  . Marital status: Single    Spouse name: Not on file  . Number of children: Not on file  . Years of education: Not on file  . Highest education level: Not on file  Occupational History  .  Not on file  Tobacco Use  . Smoking status: Never  . Smokeless tobacco: Never  Substance and Sexual Activity  . Alcohol use: Never  . Drug use: Never  . Sexual activity: Not on file  Other Topics Concern  . Not on file  Social History Narrative  . Not on file   Social Determinants of Health   Financial Resource Strain: Not on file  Food Insecurity: Not on file  Transportation Needs: Not on file  Physical Activity: Not on file  Stress: Not on file  Social Connections: Not on file  Intimate Partner Violence: Not on file      Review of  Systems  Constitutional:  Negative for chills, fatigue and unexpected weight change.  HENT:  Negative for congestion, rhinorrhea, sneezing and sore throat.   Eyes:  Negative for redness.  Respiratory:  Negative for cough, chest tightness, shortness of breath and wheezing.   Cardiovascular:  Negative for chest pain and palpitations.  Gastrointestinal:  Negative for abdominal pain, constipation, diarrhea, nausea and vomiting.  Genitourinary:  Negative for dysuria and frequency.  Musculoskeletal:  Negative for arthralgias, back pain, joint swelling and neck pain.  Skin:  Negative for rash.  Neurological: Negative.  Negative for tremors and numbness.  Hematological:  Negative for adenopathy. Does not bruise/bleed easily.  Psychiatric/Behavioral:  Negative for behavioral problems (Depression), sleep disturbance and suicidal ideas. The patient is not nervous/anxious.    Vital Signs: BP 120/80   Pulse 94   Temp 97.8 F (36.6 C)   Resp 16   Ht 5\' 1"  (1.549 m)   Wt 116 lb (52.6 kg)   SpO2 96%   BMI 21.92 kg/m    Physical Exam Vitals reviewed.  Constitutional:      General: She is not in acute distress.    Appearance: Normal appearance. She is not ill-appearing.  HENT:     Head: Normocephalic and atraumatic.  Eyes:     Extraocular Movements: Extraocular movements intact.     Pupils: Pupils are equal, round, and reactive to light.  Cardiovascular:     Rate and Rhythm: Normal rate and regular rhythm.  Pulmonary:     Effort: Pulmonary effort is normal. No respiratory distress.  Neurological:     Mental Status: She is alert and oriented to person, place, and time.  Psychiatric:        Mood and Affect: Mood normal.        Behavior: Behavior normal.       Assessment/Plan: 1. Moderate asthma without complication, unspecified whether persistent Stable with current medications, refills ordered - montelukast (SINGULAIR) 10 MG tablet; Take 1 tablet (10 mg total) by mouth daily.   Dispense: 30 tablet; Refill: 2 - ADVAIR DISKUS 100-50 MCG/ACT AEPB; Inhale 1 puff into the lungs 2 (two) times daily.  Dispense: 60 each; Refill: 5 - cetirizine (ZYRTEC) 10 MG tablet; Take 1 tablet (10 mg total) by mouth daily.  Dispense: 30 tablet; Refill: 5  2. Age-related osteoporosis without current pathological fracture Continue treatment with fosamax, refills ordered.  - alendronate (FOSAMAX) 35 MG tablet; Take 1 tablet (35 mg total) by mouth every 7 (seven) days. Take with a full glass of water on an empty stomach.  Dispense: 12 tablet; Refill: 3  3. Acquired hypothyroidism Stable, continue levothyroxine as prescribed. Will have labs drawn prior to CPE in december - levothyroxine (SYNTHROID) 75 MCG tablet; Take 1 tablet (75 mcg total) by mouth daily before breakfast.  Dispense: 30 tablet; Refill: 2  General Counseling: Paislea verbalizes understanding of the findings of todays visit and agrees with plan of treatment. I have discussed any further diagnostic evaluation that may be needed or ordered today. We also reviewed her medications today. she has been encouraged to call the office with any questions or concerns that should arise related to todays visit.    No orders of the defined types were placed in this encounter.   Meds ordered this encounter  Medications  . triamcinolone (KENALOG) 0.025 % cream    Sig: Apply 1 application topically 2 (two) times daily.    Dispense:  30 g    Refill:  3  . alendronate (FOSAMAX) 35 MG tablet    Sig: Take 1 tablet (35 mg total) by mouth every 7 (seven) days. Take with a full glass of water on an empty stomach.    Dispense:  12 tablet    Refill:  3  . levothyroxine (SYNTHROID) 75 MCG tablet    Sig: Take 1 tablet (75 mcg total) by mouth daily before breakfast.    Dispense:  30 tablet    Refill:  2  . montelukast (SINGULAIR) 10 MG tablet    Sig: Take 1 tablet (10 mg total) by mouth daily.    Dispense:  30 tablet    Refill:  2  . ADVAIR  DISKUS 100-50 MCG/ACT AEPB    Sig: Inhale 1 puff into the lungs 2 (two) times daily.    Dispense:  60 each    Refill:  5  . cetirizine (ZYRTEC) 10 MG tablet    Sig: Take 1 tablet (10 mg total) by mouth daily.    Dispense:  30 tablet    Refill:  5  . PROAIR HFA 108 (90 Base) MCG/ACT inhaler    Sig: Inhale 2 puffs into the lungs every 6 (six) hours as needed for wheezing or shortness of breath.    Dispense:  8.5 g    Refill:  3    SEE NOTE---PATIENT HAS APPT ON 11/21/20    Return in 2 months (on 03/09/2021) for CPE, [facility] PCP.   Total time spent:30 Minutes Time spent includes review of chart, medications, test results, and follow up plan with the patient.   [place] Controlled Substance Database was reviewed by me.  This patient was seen by Sallyanne Kuster, FNP-C in collaboration with Dr. Beverely Risen as a part of collaborative care agreement.   Graycen Sadlon R. Tedd Sias, MSN, FNP-C Internal medicine

## 2021-01-06 ENCOUNTER — Other Ambulatory Visit: Payer: Self-pay

## 2021-01-06 MED ORDER — PROAIR HFA 108 (90 BASE) MCG/ACT IN AERS: 2.0000 | INHALATION_SPRAY | Freq: Four times a day (QID) | RESPIRATORY_TRACT | 3 refills | Status: DC | PRN

## 2021-01-06 MED ORDER — ALBUTEROL SULFATE HFA 108 (90 BASE) MCG/ACT IN AERS: INHALATION_SPRAY | RESPIRATORY_TRACT | 5 refills | Status: DC

## 2021-03-09 ENCOUNTER — Encounter: Payer: PPO | Admitting: Nurse Practitioner

## 2021-04-07 ENCOUNTER — Other Ambulatory Visit: Payer: Self-pay | Admitting: Nurse Practitioner

## 2021-04-07 DIAGNOSIS — J45909 Unspecified asthma, uncomplicated: Secondary | ICD-10-CM

## 2021-04-24 ENCOUNTER — Other Ambulatory Visit: Payer: Self-pay | Admitting: Nurse Practitioner

## 2021-04-24 DIAGNOSIS — E039 Hypothyroidism, unspecified: Secondary | ICD-10-CM

## 2021-05-04 ENCOUNTER — Telehealth: Payer: Self-pay

## 2021-05-04 ENCOUNTER — Other Ambulatory Visit: Payer: Self-pay

## 2021-05-04 MED ORDER — FLUTICASONE-SALMETEROL 100-50 MCG/ACT IN AEPB: 1.0000 | INHALATION_SPRAY | Freq: Two times a day (BID) | RESPIRATORY_TRACT | 3 refills | Status: DC

## 2021-05-04 NOTE — Telephone Encounter (Signed)
Received a PA for the Advair diskus 100-50, spoke to pt and she advised that her insurance will cover the generic brand FLUTICASONE-SALMETEROL, so I sent in rx to her pharmacy for it.  Insurance person called and I advised that we changed to the generic brand and he informed me that the generic was covered and no PA was needed at this time.

## 2021-05-08 ENCOUNTER — Telehealth: Payer: Self-pay

## 2021-05-08 NOTE — Telephone Encounter (Signed)
Karen Elliott [phone] [employer] Fax Number.

## 2021-05-12 ENCOUNTER — Encounter: Payer: PPO | Admitting: Nurse Practitioner

## 2021-05-22 ENCOUNTER — Other Ambulatory Visit: Payer: Self-pay | Admitting: Nurse Practitioner

## 2021-05-22 DIAGNOSIS — E039 Hypothyroidism, unspecified: Secondary | ICD-10-CM

## 2021-05-25 ENCOUNTER — Telehealth: Payer: Self-pay

## 2021-05-25 ENCOUNTER — Ambulatory Visit: Payer: PPO | Admitting: Nurse Practitioner

## 2021-05-25 ENCOUNTER — Other Ambulatory Visit: Payer: Self-pay

## 2021-05-25 DIAGNOSIS — E039 Hypothyroidism, unspecified: Secondary | ICD-10-CM

## 2021-05-25 MED ORDER — LEVOTHYROXINE SODIUM 75 MCG PO TABS: 75.0000 ug | ORAL_TABLET | Freq: Every day | ORAL | 1 refills | Status: DC

## 2021-05-25 NOTE — Telephone Encounter (Signed)
[facility] - Client ?Nonclinical Telephone Record  ?[facility]? ?Client [facility] - Client ?Client Site [place] ?Contact Type Call ?Who Is Calling Patient / Member / Family / Caregiver ?Caller Name Anahly Villena ?Caller Phone Number [phone] ?Patient Name Karen Elliott ?Patient DOB 02-26-1966 ?Call Type Message Only Information Provided ?Reason for Call Request for General Office Information ?Initial Comment Caller states that she would like to confirm her appointment for 06/16/2021. ?Additional Comment Office hours provided. Bill/step father [phone] if you can't get ahold of the patient. She ?would like to confirm the time of the appointment. ?Disp. Time Disposition Final User ?05/24/2021 5:13:11 PM General Information Provided Yes Lynne Logan, Amy ?Call Closed By: Sherlynn Stalls ?Transaction Date/Time: 05/24/2021 5:08:19 PM (ET ?

## 2021-07-06 ENCOUNTER — Encounter: Payer: Self-pay | Admitting: Nurse Practitioner

## 2021-07-06 ENCOUNTER — Ambulatory Visit (INDEPENDENT_AMBULATORY_CARE_PROVIDER_SITE_OTHER): Payer: PPO | Admitting: Nurse Practitioner

## 2021-07-06 VITALS — BP 144/92 | HR 82 | Temp 96.8°F | Resp 14 | Ht 61.0 in | Wt 107.0 lb

## 2021-07-06 DIAGNOSIS — Z1231 Encounter for screening mammogram for malignant neoplasm of breast: Secondary | ICD-10-CM

## 2021-07-06 DIAGNOSIS — Z7689 Persons encountering health services in other specified circumstances: Secondary | ICD-10-CM

## 2021-07-06 DIAGNOSIS — M810 Age-related osteoporosis without current pathological fracture: Secondary | ICD-10-CM | POA: Diagnosis not present

## 2021-07-06 DIAGNOSIS — Z1211 Encounter for screening for malignant neoplasm of colon: Secondary | ICD-10-CM

## 2021-07-06 DIAGNOSIS — G809 Cerebral palsy, unspecified: Secondary | ICD-10-CM

## 2021-07-06 DIAGNOSIS — E039 Hypothyroidism, unspecified: Secondary | ICD-10-CM

## 2021-07-06 DIAGNOSIS — J45909 Unspecified asthma, uncomplicated: Secondary | ICD-10-CM | POA: Diagnosis not present

## 2021-07-06 DIAGNOSIS — E7800 Pure hypercholesterolemia, unspecified: Secondary | ICD-10-CM | POA: Diagnosis not present

## 2021-07-06 DIAGNOSIS — R269 Unspecified abnormalities of gait and mobility: Secondary | ICD-10-CM

## 2021-07-06 LAB — COMPREHENSIVE METABOLIC PANEL
ALT: 29 U/L (ref 0–35)
AST: 30 U/L (ref 0–37)
Albumin: 4.4 g/dL (ref 3.5–5.2)
Alkaline Phosphatase: 120 U/L — ABNORMAL HIGH (ref 39–117)
BUN: 10 mg/dL (ref 6–23)
CO2: 33 mEq/L — ABNORMAL HIGH (ref 19–32)
Calcium: 9.5 mg/dL (ref 8.4–10.5)
Chloride: 103 mEq/L (ref 96–112)
Creatinine, Ser: 0.68 mg/dL (ref 0.40–1.20)
GFR: 97.78 mL/min (ref >60.00)
Glucose, Bld: 91 mg/dL (ref 70–99)
Potassium: 4 mEq/L (ref 3.5–5.1)
Sodium: 141 mEq/L (ref 135–145)
Total Bilirubin: 0.3 mg/dL (ref 0.2–1.2)
Total Protein: 6.7 g/dL (ref 6.0–8.3)

## 2021-07-06 LAB — CBC
HCT: 39.8 % (ref 36.0–46.0)
Hemoglobin: 13.4 g/dL (ref 12.0–15.0)
MCHC: 33.7 g/dL (ref 30.0–36.0)
MCV: 89.2 fl (ref 78.0–100.0)
Platelets: 197 10*3/uL (ref 150.0–400.0)
RBC: 4.46 Mil/uL (ref 3.87–5.11)
RDW: 13.3 % (ref 11.5–15.5)
WBC: 4.4 10*3/uL (ref 4.0–10.5)

## 2021-07-06 LAB — LIPID PANEL
Cholesterol: 202 mg/dL — ABNORMAL HIGH (ref 0–200)
HDL: 80.7 mg/dL (ref >39.00)
LDL Cholesterol: 104 mg/dL — ABNORMAL HIGH (ref 0–99)
NonHDL: 120.97
Total CHOL/HDL Ratio: 2
Triglycerides: 84 mg/dL (ref 0.0–149.0)
VLDL: 16.8 mg/dL (ref 0.0–40.0)

## 2021-07-06 LAB — VITAMIN D 25 HYDROXY (VIT D DEFICIENCY, FRACTURES): VITD: 50.2 ng/mL (ref 30.00–100.00)

## 2021-07-06 LAB — TSH: TSH: 2.18 u[IU]/mL (ref 0.35–5.50)

## 2021-07-06 MED ORDER — MONTELUKAST SODIUM 10 MG PO TABS: 10.0000 mg | ORAL_TABLET | Freq: Every day | ORAL | 5 refills | Status: DC

## 2021-07-06 MED ORDER — CETIRIZINE HCL 10 MG PO TABS: 10.0000 mg | ORAL_TABLET | Freq: Every day | ORAL | 5 refills | Status: DC

## 2021-07-06 NOTE — Assessment & Plan Note (Signed)
Noted in previous laboratory findings.  Pending lipid panel today. ?

## 2021-07-06 NOTE — Assessment & Plan Note (Signed)
History of hypothyroidism.  Patient currently maintained on levothyroxine 75 mcg daily.  Pending laboratory studies.  Continue taking medication as prescribed ?

## 2021-07-06 NOTE — Assessment & Plan Note (Signed)
Patient does have several palsy.  This does affect her gait along with having left leg shorter than right.  Currently maintained with orthotic, walker, and cane.  States she is able to walk around her apartment complex to certain stores but does have the fear of falling off of her with stepping over and is attempted to cross highways that are busy with traffic.  Patient did bring in a link form to be filled out by healthcare provider for transportation.  Form filled out copy back to patient. ?

## 2021-07-06 NOTE — Assessment & Plan Note (Signed)
Diagnosed on DEXA scan in 2021.  Patient has prescription for alendronate 35 mg once weekly but states she does not take the medication she is scared of the side effects and causing more problems.  She has been taking calcium and vitamin D currently.  She will be due for DEXA scan in 1 month.  Place order for updated DEXA scan pending results.  Continue taking calcium and vitamin D ?

## 2021-07-06 NOTE — Progress Notes (Signed)
? ?New Patient Office Visit ? ?Subjective   ? ?Patient ID: Karen Elliott, female    DOB: 1965/04/15  Age: 56 y.o. MRN: [id number] ? ?CC:  ?Chief Complaint  ?Patient presents with  ? Establish Care  ?  Previous PCP Alyssa Abernathy  ? ? ?HPI ?Karen Elliott presents to establish care ? ? ?Asthma: Adviar and albuterol. Currenlty out of adviar. States that she use albuterol rarely with adviar. Will use it with bad and hot weather. Antihistamine and singular along with her inhalers.  In the chart patient was found by Jonetta Osgood from pulmonology last office visit 01/03/2021. ? ?Hypothyroidism: Currently maintained on levothyroxiene 78mcg. Seems to be taking medication as described. ? ?Cerebral Palsy: Patient uses a walker all the time outside of the home and has heel lengthing on the left side due to 1 leg being shorter than the other.  States that she lives alone and can walk. She was followed foot and ankle.  This has caused a gait abnormality.  States she has not scared to walk places around her housing but is scared of curbs and busy highways and she feels like she is at risk for falling and has a fear of being struck by traffic while trying to cross. ? ?Osteoporosis: calcium and vitamin D. Not taking the fosamax.  Last DEXA scan approximately 2 years ago that did show osteoporosis.  Will order new DEXA scan prior to encouraging and changing therapy ? ?Abnormal gait:one leg is shorter than the other.  Has insoles and shoes levels are out.  Does use a walker when she is out and about and a cane at home. ? ? ?Likes to write stories and poetry ? ?Immunizations: ?-Tetanus: unsure when the last injection was.  States she is hesitant with immunizations ?-Influenza: refused ?-Covid-19: refused ?-Shingles: information  ?-Pneumonia: N/A ? ?-HPV: Doubt ? ?Seizures as a kid after a traumatic brain injury.  Was followed by neurology and cleared around the age approximate 56 years old.  Patient not currently on any  antiepileptic medications currently ? ? ?Eye exam: Completes annually. Wears glasses. Eye doctor  ?Dental exam: Completes semi-annually  ? ?Pap Smear: Completed in unsure.  States she has not been followed by GYN but states that anytime she has had a Pap smear in office it is quite uncomfortable patient has a hard time dealing with. ?Mammogram: Completed in 2021. Needs updating.  Order placed today ?Colonoscopy: ordered for [facility]. ?Dexa: Completed in 2021, did show osteoporosis new DEXA scan ordered today ? ?Lung Cancer Screening: NA ? 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  Patient states that she has lots of knocking the doors in the management come by in her apartment complex.  Feels rested, per patient report ? ?Outpatient Encounter Medications as of 07/06/2021  ?Medication Sig  ? ADVAIR DISKUS 100-50 MCG/ACT AEPB Inhale 1 puff into the lungs 2 (two) times daily.  ? albuterol (VENTOLIN HFA) 108 (90 Base) MCG/ACT inhaler Inhale 2 puffs into the lungs every 6 (six) hours as needed for wheezing or shortness of breath  ? alendronate (FOSAMAX) 35 MG tablet Take 1 tablet (35 mg total) by mouth every 7 (seven) days. Take with a full glass of water on an empty stomach.  ? fluticasone-salmeterol (ADVAIR) 100-50 MCG/ACT AEPB Inhale 1 puff into the lungs 2 (two) times daily. Inhale 1 puff into lungs 2 (two) times daily  ? levothyroxine (SYNTHROID) 75 MCG tablet Take 1 tablet (75 mcg total) by mouth daily  before breakfast.  ? Multiple Vitamin (MULTIVITAMIN) capsule Take 1 capsule by mouth daily.  ? triamcinolone (KENALOG) 0.025 % cream Apply 1 application topically 2 (two) times daily.  ? [DISCONTINUED] cetirizine (ZYRTEC) 10 MG tablet Take 1 tablet (10 mg total) by mouth daily.  ? [DISCONTINUED] montelukast (SINGULAIR) 10 MG tablet TAKE 1 TABLET BY MOUTH DAILY  ? cetirizine (ZYRTEC) 10 MG tablet Take 1 tablet (10 mg total) by mouth daily.  ? montelukast (SINGULAIR) 10 MG tablet Take 1 tablet (10 mg total) by mouth  daily.  ? [DISCONTINUED] GLUCOSAMINE-CALCIUM-VIT D PO Take by mouth.  ? ?No facility-administered encounter medications on file as of 07/06/2021.  ? ? ?Past Medical History:  ?Diagnosis Date  ? Allergy   ? Asthma   ? Cerebral palsy ([place])   ? Hypothyroidism   ? ? ?Past Surgical History:  ?Procedure Laterality Date  ? OTHER SURGICAL HISTORY    ? heel extended/lengthening  ? TONSILLECTOMY    ? ? ?Family History  ?Problem Relation Age of Onset  ? Asthma Mother   ? Thyroid cancer Mother   ? Bladder Cancer Father   ? Asthma Sister   ? Asthma Brother   ? GER disease Brother   ? ? ?Social History  ? ?Socioeconomic History  ? Marital status: Single  ?  Spouse name: Not on file  ? Number of children: Not on file  ? Years of education: Not on file  ? Highest education level: Not on file  ?Occupational History  ? Not on file  ?Tobacco Use  ? Smoking status: Never  ? Smokeless tobacco: Never  ?Substance and Sexual Activity  ? Alcohol use: Never  ? Drug use: Never  ? Sexual activity: Not on file  ?Other Topics Concern  ? Not on file  ?Social History Narrative  ? Not on file  ? ?Social Determinants of Health  ? ?Financial Resource Strain: Not on file  ?Food Insecurity: Not on file  ?Transportation Needs: Not on file  ?Physical Activity: Not on file  ?Stress: Not on file  ?Social Connections: Not on file  ?Intimate Partner Violence: Not on file  ? ? ?Review of Systems  ?Constitutional:  Positive for malaise/fatigue. Negative for chills and fever.  ?     Gait abnormality patient uses walker in office and has cane at home.  ?Eyes:   ?     Corrective lenses  ?Respiratory:  Negative for shortness of breath and wheezing.   ?Cardiovascular:  Negative for chest pain, palpitations and leg swelling.  ?Gastrointestinal:  Negative for constipation, nausea and vomiting.  ?     BM dialy  ?Genitourinary:  Negative for dysuria and frequency.  ?Neurological:  Positive for seizures. Negative for dizziness and headaches.  ?Psychiatric/Behavioral:   Negative for hallucinations and suicidal ideas.   ? ?  ? ? ?Objective   ? ?BP (!) 144/92   Pulse 82   Temp (!) 96.8 ?F (36 ?C)   Resp 14   Ht 5\' 1"  (1.549 m)   Wt 107 lb (48.5 kg)   SpO2 98%   BMI 20.22 kg/m?  ? ?Physical Exam ?Vitals and nursing note reviewed.  ?Constitutional:   ?   Appearance: Normal appearance.  ?   Comments: She is accompanied by sister who left exam room during interview and physical exam.  Patient uses walker  ?HENT:  ?   Right Ear: Tympanic membrane, ear canal and external ear normal.  ?   Left Ear: Tympanic membrane,  ear canal and external ear normal.  ?   Mouth/Throat:  ?   Mouth: Mucous membranes are moist.  ?   Pharynx: Oropharynx is clear.  ?Eyes:  ?   Extraocular Movements: Extraocular movements intact.  ?   Pupils: Pupils are equal, round, and reactive to light.  ?   Comments: Corrective lenses.  ?Neck:  ?   Thyroid: No thyroid mass, thyromegaly or thyroid tenderness.  ?Cardiovascular:  ?   Rate and Rhythm: Normal rate and regular rhythm.  ?   Pulses: Normal pulses.  ?   Heart sounds: Normal heart sounds.  ?Pulmonary:  ?   Effort: Pulmonary effort is normal.  ?   Breath sounds: Normal breath sounds.  ?Abdominal:  ?   General: Bowel sounds are normal. There is no distension.  ?   Palpations: There is no mass.  ?   Tenderness: There is no abdominal tenderness.  ?   Hernia: No hernia is present.  ?Musculoskeletal:  ?   Right lower leg: No edema.  ?   Left lower leg: No edema.  ?   Comments: Patient's left leg is shorter of the right is fixed with orthotics. ? ?Patient also has an abnormal gait  ?Lymphadenopathy:  ?   Cervical: No cervical adenopathy.  ?Skin: ?   General: Skin is warm.  ?Neurological:  ?   General: No focal deficit present.  ?   Mental Status: She is alert.  ?   Deep Tendon Reflexes:  ?   Reflex Scores: ?     Bicep reflexes are 2+ on the right side and 2+ on the left side. ?     Patellar reflexes are 2+ on the right side and 2+ on the left side. ?   Comments:  Bilateral upper and lower extremity strength 5/5  ?Psychiatric:     ?   Mood and Affect: Mood normal.     ?   Behavior: Behavior normal.     ?   Thought Content: Thought content normal.     ?   Judgment: Judgment normal.  ?

## 2021-07-06 NOTE — Patient Instructions (Signed)
Nice to see you today ?I will be in touch with the lab results once I have them ?I sent in 2 refills. If you need more refills let me know ?Follow up with me in 6 months, sooner if needed ?

## 2021-07-06 NOTE — Assessment & Plan Note (Signed)
Did review last office note in EMR from [facility]. ?

## 2021-07-06 NOTE — Assessment & Plan Note (Signed)
Currently maintained on albuterol inhaler, Advair inhaler, Singulair, and second-generation histamine.  Per last office note was followed by Sallyanne Kuster through pulmonology.  According to patient she was discharged from the clinic.  Does not need medications at current time continue medication as prescribed ?

## 2021-07-06 NOTE — Assessment & Plan Note (Signed)
Patient does have abnormal gait likely due to different length legs and cerebral palsy.  Patient is able to ambulate with assistance or with walker and/or cane.  Continue using assistive device to reduce risk for falls ?

## 2021-07-07 ENCOUNTER — Telehealth: Payer: Self-pay

## 2021-07-07 NOTE — Telephone Encounter (Signed)
I spoke with pt and she will ck with pharmacy to see if she has any refills of her inhaler. Pt said she is not going back to pulmonology. Pt will cb if needed. Nothng further needed at this time. ?

## 2021-07-07 NOTE — Telephone Encounter (Signed)
[facility] Night - Client ?TELEPHONE ADVICE RECORD ?Access[facility]urse? ?Patient ?Name: ?Karen G ?Elliott ?Gender: Female ?DOB: November 11, 1965 ?Age: 56 Y 37 M 20 D ?Return ?Phone ?Number: ?[id number] ?(Primary), ?[id number] ?(Secondary) ?Address: ?City/ ?State/ ?Zip: ?[place] ? [address] ?Client [facility] - Client ?Client Site [place] ?Provider Romilda Garret- NP ?Contact Type Call ?Who Is Calling Patient / Member / Family / Caregiver ?Call Type Triage / Clinical ?Relationship To Patient Self ?Return Phone Number [phone] (Primary) ?Chief Complaint Prescription Refill or Medication Request (non ?symptomatic) ?Reason for Call Medication Question / Request ?Initial Comment Caller states that she had called in her medication ?and still has not gotten them. She needs to get her ?inhaler. Pharmacy number [phone] ?Translation No ?Nurse Assessment ?Nurse: Ferdinand Lango, RN, Kenney Houseman Date/Time (Eastern Time): 07/06/2021 9:21:17 PM ?Confirm and document reason for call. If ?symptomatic, describe symptoms. ?---Caller states her prescription for her inhaler did ?not make it to her pharmacy and she needs it. Denies ?any issues with breathing at this time, declines triage. ?Advised that she call MD back in the morning to f/u. ?Does the patient have any new or worsening ?symptoms? ---No ?Please document clinical information provided and ?list any resource used. ?---Advised caller to call back if she develops any SOB ?or issues ?Disp. Time (Eastern ?Time) Disposition Final User ?07/06/2021 9:24:01 PM Clinical Call Yes Ferdinand Lango, RN, Nicole Kindred ?

## 2021-07-11 ENCOUNTER — Telehealth: Payer: Self-pay

## 2021-07-11 NOTE — Telephone Encounter (Signed)
CALLED PATIENT NO ANSWER LEFT VOICEMAIL FOR A CALL BACK °Letter sent °

## 2021-07-11 NOTE — Telephone Encounter (Signed)
-----   Message from Eden Emms, NP sent at 07/07/2021  7:38 PM EDT ----- ?I have reviewed your labs. Overall they look ok. Kidneys look good. Your liver function is up but has improved since the last time it was checked. Cholesterol is slightly high at 104 and we want it under 100 but much improved since a few years back. Thyroid, vitamin D, red and white blood cells are good. ?

## 2021-07-11 NOTE — Telephone Encounter (Signed)
Tried to call patient call couldn't be completed at this time  ?

## 2021-07-25 ENCOUNTER — Other Ambulatory Visit: Payer: Self-pay | Admitting: Nurse Practitioner

## 2021-07-25 DIAGNOSIS — E039 Hypothyroidism, unspecified: Secondary | ICD-10-CM

## 2021-08-29 ENCOUNTER — Other Ambulatory Visit: Payer: Self-pay | Admitting: Nurse Practitioner

## 2021-09-15 ENCOUNTER — Telehealth: Payer: Self-pay | Admitting: Nurse Practitioner

## 2021-09-15 NOTE — Telephone Encounter (Signed)
Left message for patient to call back and schedule Medicare Annual Wellness Visit (AWV).   Please offer to do virtually or by telephone.   Last AWV:10/06/2019  Please schedule at anytime with [facility] schedule 2  45 minute appointent  If any questions, please contact me at [phone]

## 2021-10-04 ENCOUNTER — Telehealth: Payer: Self-pay | Admitting: Nurse Practitioner

## 2021-10-04 DIAGNOSIS — G809 Cerebral palsy, unspecified: Secondary | ICD-10-CM

## 2021-10-04 DIAGNOSIS — R269 Unspecified abnormalities of gait and mobility: Secondary | ICD-10-CM

## 2021-10-04 NOTE — Telephone Encounter (Signed)
Spoke with April, states patient has had a raised seat for a long time and it is basically so old it needs to be thrown out now. She has Cerebral Palsy and has abnormal gait due to this and this puts patient at high risk for falls. It makes it hard for her to get up from the toilet due to the lower level for arm bar that she has to use to get up. They will be using [facility]. Would like to get the order mailed please at [address] [place], [place] [address]

## 2021-10-04 NOTE — Telephone Encounter (Signed)
Patient sister called April Weaver and stated that patient need orders of a raise toilet seat so that it can be turned into the insurance. Call back number [phone].

## 2021-10-05 NOTE — Telephone Encounter (Signed)
It is fine to write the order and mail it to where they request

## 2021-10-05 NOTE — Telephone Encounter (Signed)
Order printed and put to be mailed. I called April and left a message letting her know this.

## 2021-11-29 ENCOUNTER — Telehealth: Payer: Self-pay

## 2021-11-29 ENCOUNTER — Other Ambulatory Visit: Payer: Self-pay | Admitting: Nurse Practitioner

## 2021-11-29 NOTE — Telephone Encounter (Signed)
Patient has been rescheduled 01/09/2022 at 11:40.

## 2021-11-29 NOTE — Telephone Encounter (Signed)
[facility] Night - Client Nonclinical Telephone Record  [facility] Client [facility] Night - Client Client Site [facility] - Night Provider AA - PHYSICIAN, NOT LISTED- MD Contact Type Call Who Is Calling Patient / Member / Family / Caregiver Caller Name Cyanna Neace Phone Number [phone] Patient Name Karen Elliott Patient DOB 1965/11/19 Call Type Message Only Information Provided Reason for Call Request to Reschedule Office Appointment Initial Comment Caller stated she, will not be able to make 9am appointment on 01/08/2022. Patient request to speak to RN No Disp. Time Disposition Final User 11/28/2021 7:30:21 PM General Information Provided Yes Thresa Ross Call Closed By: Thresa Ross Transaction Date/Time: 11/28/2021 7:24:42 PM (ET

## 2022-01-02 ENCOUNTER — Other Ambulatory Visit: Payer: Self-pay | Admitting: Nurse Practitioner

## 2022-01-02 DIAGNOSIS — J45909 Unspecified asthma, uncomplicated: Secondary | ICD-10-CM

## 2022-01-04 ENCOUNTER — Telehealth: Payer: Self-pay | Admitting: Nurse Practitioner

## 2022-01-04 NOTE — Telephone Encounter (Signed)
Patient returned my call  Patient declined AWV

## 2022-01-04 NOTE — Telephone Encounter (Signed)
Left message for patient to call back and schedule Medicare Annual Wellness Visit (AWV) either virtually or phone  Left  my jabber number [phone]   Last AWV  10/06/19   45 min for awv-i and in office appointments 30 min for awv-s  phone/virtual appointments

## 2022-01-08 ENCOUNTER — Ambulatory Visit: Payer: PPO | Admitting: Nurse Practitioner

## 2022-01-09 ENCOUNTER — Encounter: Payer: Self-pay | Admitting: Nurse Practitioner

## 2022-01-09 ENCOUNTER — Ambulatory Visit (INDEPENDENT_AMBULATORY_CARE_PROVIDER_SITE_OTHER): Payer: PPO | Admitting: Nurse Practitioner

## 2022-01-09 VITALS — BP 132/74 | HR 95 | Temp 96.8°F | Resp 16 | Ht 62.0 in | Wt 108.1 lb

## 2022-01-09 DIAGNOSIS — Z1231 Encounter for screening mammogram for malignant neoplasm of breast: Secondary | ICD-10-CM | POA: Diagnosis not present

## 2022-01-09 DIAGNOSIS — E039 Hypothyroidism, unspecified: Secondary | ICD-10-CM | POA: Diagnosis not present

## 2022-01-09 DIAGNOSIS — M810 Age-related osteoporosis without current pathological fracture: Secondary | ICD-10-CM | POA: Diagnosis not present

## 2022-01-09 DIAGNOSIS — E7800 Pure hypercholesterolemia, unspecified: Secondary | ICD-10-CM

## 2022-01-09 DIAGNOSIS — Z13820 Encounter for screening for osteoporosis: Secondary | ICD-10-CM

## 2022-01-09 DIAGNOSIS — Z0000 Encounter for general adult medical examination without abnormal findings: Secondary | ICD-10-CM | POA: Diagnosis not present

## 2022-01-09 DIAGNOSIS — J45909 Unspecified asthma, uncomplicated: Secondary | ICD-10-CM | POA: Diagnosis not present

## 2022-01-09 DIAGNOSIS — G809 Cerebral palsy, unspecified: Secondary | ICD-10-CM | POA: Diagnosis not present

## 2022-01-09 DIAGNOSIS — Z1211 Encounter for screening for malignant neoplasm of colon: Secondary | ICD-10-CM

## 2022-01-09 LAB — COMPREHENSIVE METABOLIC PANEL
ALT: 39 U/L — ABNORMAL HIGH (ref 0–35)
AST: 35 U/L (ref 0–37)
Albumin: 4.3 g/dL (ref 3.5–5.2)
Alkaline Phosphatase: 124 U/L — ABNORMAL HIGH (ref 39–117)
BUN: 7 mg/dL (ref 6–23)
CO2: 33 mEq/L — ABNORMAL HIGH (ref 19–32)
Calcium: 9.4 mg/dL (ref 8.4–10.5)
Chloride: 103 mEq/L (ref 96–112)
Creatinine, Ser: 0.63 mg/dL (ref 0.40–1.20)
GFR: 99.24 mL/min (ref >60.00)
Glucose, Bld: 104 mg/dL — ABNORMAL HIGH (ref 70–99)
Potassium: 3.7 mEq/L (ref 3.5–5.1)
Sodium: 142 mEq/L (ref 135–145)
Total Bilirubin: 0.3 mg/dL (ref 0.2–1.2)
Total Protein: 6.6 g/dL (ref 6.0–8.3)

## 2022-01-09 LAB — LIPID PANEL
Cholesterol: 202 mg/dL — ABNORMAL HIGH (ref 0–200)
HDL: 77.2 mg/dL (ref >39.00)
LDL Cholesterol: 105 mg/dL — ABNORMAL HIGH (ref 0–99)
NonHDL: 124.67
Total CHOL/HDL Ratio: 3
Triglycerides: 98 mg/dL (ref 0.0–149.0)
VLDL: 19.6 mg/dL (ref 0.0–40.0)

## 2022-01-09 LAB — TSH: TSH: 1.15 u[IU]/mL (ref 0.35–5.50)

## 2022-01-09 LAB — CBC
HCT: 38.3 % (ref 36.0–46.0)
Hemoglobin: 12.8 g/dL (ref 12.0–15.0)
MCHC: 33.5 g/dL (ref 30.0–36.0)
MCV: 89.5 fl (ref 78.0–100.0)
Platelets: 181 10*3/uL (ref 150.0–400.0)
RBC: 4.28 Mil/uL (ref 3.87–5.11)
RDW: 12.9 % (ref 11.5–15.5)
WBC: 3.7 10*3/uL — ABNORMAL LOW (ref 4.0–10.5)

## 2022-01-09 NOTE — Assessment & Plan Note (Signed)
Historical diagnosis.  Patient does have orthotic in 1 shoe uses a walker and cane.  Able to ambulate independently

## 2022-01-09 NOTE — Assessment & Plan Note (Signed)
Patient currently maintained on Advair, Singulair, and albuterol as needed.  Stable regimen continue.

## 2022-01-09 NOTE — Patient Instructions (Signed)
Nice to see you today I will be in touch with the labs once I have them Follow up with me in 1 year for your next physical, sooner if you need me  Get tetanus at your local pharmacy

## 2022-01-09 NOTE — Assessment & Plan Note (Signed)
Currently maintained on levothyroxine 75 mcg.  Continue medication as prescribed pending lab result.

## 2022-01-09 NOTE — Assessment & Plan Note (Signed)
Pending lab results today.

## 2022-01-09 NOTE — Progress Notes (Signed)
Established Patient Office Visit  Subjective   Patient ID: Karen Elliott, female    DOB: [birth year]  Age: 56 y.o. MRN: [id number]  Chief Complaint  Patient presents with   Annual Exam    HPI   Asthma: Is on the albuterol and advair. States that she used the albuterol infrequent   Hypothyroidism: Patient currently mains on levothyroxine and tolerating medication well.  Cerbral palsy: Historical diagnosis.  Patient does ambulate with a walker.  She is very careful and cognizant when it comes to falling.   for complete physical and follow up of chronic conditions.  Immunizations: -Tetanus: Information reviewed to receive at local pharmacy -Influenza: Patient did not want today but information discussed and given. -Shingles: information given  -Pneumonia: Too young  -HPV: Aged out  Diet: Fair diet. 2 meals a day. Loves fruits and vegetables. Water.  Exercise: No regular exercise. Walks around the house and around   [place] exam: Completes annually.  Next appointment on the first of November Dental exam: Completes semi-annually   Pap Smear: Needs updating.  Patient declined.  Did discuss the possibility of having a cervical cancer without any symptoms presenting.  The patient and patient's family were both acknowledged, understood and patient did decline exam today Mammogram: Completed in 08/06/2019.  Order placed today  Colonoscopy: Has not had done.  Ambulatory referral to gastroenterology in [place] done today. Lung Cancer Screening: N/A Dexa: Completed in 08/06/2019.  That did show osteoporosis patient on alendronate 35 mg daily.  New order for updated bone density scan placed today  Sleep: goes to bed around 1030-11 and gets up 730 am. Feels rested. Does not snore      Review of Systems  Constitutional:  Negative for chills and fever.  Respiratory:  Negative for shortness of breath.   Cardiovascular:  Negative for chest pain.  Gastrointestinal:  Negative for  abdominal pain and diarrhea.  Neurological:  Negative for headaches.      Objective:     BP 132/74   Pulse 95   Temp (!) 96.8 F (36 C) (Temporal)   Resp 16   Ht 5\' 2"  (1.575 m) Comment: with shoes on  Wt 108 lb 2 oz (49 kg)   SpO2 98%   BMI 19.78 kg/m  BP Readings from Last 3 Encounters:  01/09/22 132/74  07/06/21 (!) 144/92  01/03/21 120/80   Wt Readings from Last 3 Encounters:  01/09/22 108 lb 2 oz (49 kg)  07/06/21 107 lb (48.5 kg)  01/03/21 116 lb (52.6 kg)      Physical Exam Vitals and nursing note reviewed.  Constitutional:      Appearance: Normal appearance.     Comments: Patient has walker from home in clinic today.  HENT:     Right Ear: Tympanic membrane, ear canal and external ear normal.     Left Ear: Tympanic membrane, ear canal and external ear normal.     Mouth/Throat:     Mouth: Mucous membranes are moist.     Pharynx: Oropharynx is clear.  Eyes:     Extraocular Movements: Extraocular movements intact.     Pupils: Pupils are equal, round, and reactive to light.     Comments: Wears glasses  Cardiovascular:     Rate and Rhythm: Normal rate and regular rhythm.     Heart sounds: Normal heart sounds.  Pulmonary:     Effort: Pulmonary effort is normal.     Breath sounds: Normal breath sounds.  Abdominal:  General: Bowel sounds are normal. There is no distension.     Palpations: There is no mass.     Tenderness: There is no abdominal tenderness.     Hernia: No hernia is present.  Musculoskeletal:     Right lower leg: No edema.     Left lower leg: No edema.  Lymphadenopathy:     Cervical: No cervical adenopathy.  Skin:    General: Skin is warm.  Neurological:     General: No focal deficit present.     Mental Status: She is alert. Mental status is at baseline.     Deep Tendon Reflexes:     Reflex Scores:      Bicep reflexes are 2+ on the right side and 2+ on the left side.      Patellar reflexes are 2+ on the right side and 2+ on the left  side.    Comments: Bilateral upper and lower extremity strength 5/5      No results found for any visits on 01/09/22.    The 10-year ASCVD risk score (Arnett DK, et al., 2019) is: 1.7%    Assessment & Plan:   Problem List Items Addressed This Visit       Respiratory   Moderate asthma without complication    Patient currently maintained on Advair, Singulair, and albuterol as needed.  Stable regimen continue.        Endocrine   Acquired hypothyroidism    Currently maintained on levothyroxine 75 mcg.  Continue medication as prescribed pending lab result.        Nervous and Auditory   Cerebral palsy (HCC)    Historical diagnosis.  Patient does have orthotic in 1 shoe uses a walker and cane.  Able to ambulate independently        Musculoskeletal and Integument   Age-related osteoporosis without current pathological fracture    Due for DEXA scan update, order placed today.  Continue alendronate 35 mg daily.      Relevant Orders   DG Bone Density     Other   Screening for osteoporosis   Preventative health care - Primary    Discussed age-appropriate immunizations and screening exams.  Patient was given packet of information at dismissal of clinic for preventative healthcare maintenance for age range that had anticipatory guidance.      Relevant Orders   CBC   Lipid panel   Comprehensive metabolic panel   TSH   Hypercholesterolemia    Pending lab results today.      Other Visit Diagnoses     Screening for colon cancer       Relevant Orders   Ambulatory referral to Gastroenterology   Encounter for screening mammogram for malignant neoplasm of breast       Relevant Orders   MM Digital Screening       Return in about 1 year (around 01/10/2023) for CPE and labs.    Romilda Garret, NP

## 2022-01-09 NOTE — Assessment & Plan Note (Signed)
Discussed age-appropriate immunizations and screening exams.  Patient was given packet of information at dismissal of clinic for preventative healthcare maintenance for age range that had anticipatory guidance.

## 2022-01-09 NOTE — Assessment & Plan Note (Signed)
Due for DEXA scan update, order placed today.  Continue alendronate 35 mg daily.

## 2022-01-10 ENCOUNTER — Other Ambulatory Visit (INDEPENDENT_AMBULATORY_CARE_PROVIDER_SITE_OTHER): Payer: PPO

## 2022-01-10 DIAGNOSIS — R7989 Other specified abnormal findings of blood chemistry: Secondary | ICD-10-CM | POA: Diagnosis not present

## 2022-01-10 LAB — GAMMA GT: GGT: 47 U/L (ref 7–51)

## 2022-01-11 ENCOUNTER — Other Ambulatory Visit: Payer: Self-pay | Admitting: Nurse Practitioner

## 2022-01-11 DIAGNOSIS — R7989 Other specified abnormal findings of blood chemistry: Secondary | ICD-10-CM

## 2022-01-11 DIAGNOSIS — R748 Abnormal levels of other serum enzymes: Secondary | ICD-10-CM

## 2022-01-17 ENCOUNTER — Telehealth: Payer: Self-pay

## 2022-01-17 NOTE — Telephone Encounter (Signed)
Left voice message for patient to call back.  Colonoscopy Screening Referral 01/09/22.  Thanks,  [place], [place]

## 2022-03-07 ENCOUNTER — Other Ambulatory Visit: Payer: Self-pay | Admitting: Nurse Practitioner

## 2022-03-07 DIAGNOSIS — E039 Hypothyroidism, unspecified: Secondary | ICD-10-CM

## 2022-04-11 ENCOUNTER — Other Ambulatory Visit: Payer: PPO

## 2022-04-18 ENCOUNTER — Other Ambulatory Visit (INDEPENDENT_AMBULATORY_CARE_PROVIDER_SITE_OTHER): Payer: PPO

## 2022-04-18 DIAGNOSIS — R748 Abnormal levels of other serum enzymes: Secondary | ICD-10-CM | POA: Diagnosis not present

## 2022-04-18 DIAGNOSIS — R7989 Other specified abnormal findings of blood chemistry: Secondary | ICD-10-CM

## 2022-04-19 LAB — HEPATIC FUNCTION PANEL
ALT: 17 U/L (ref 0–35)
AST: 24 U/L (ref 0–37)
Albumin: 4.6 g/dL (ref 3.5–5.2)
Alkaline Phosphatase: 100 U/L (ref 39–117)
Bilirubin, Direct: 0.1 mg/dL (ref 0.0–0.3)
Total Bilirubin: 0.3 mg/dL (ref 0.2–1.2)
Total Protein: 7.1 g/dL (ref 6.0–8.3)

## 2022-04-19 LAB — CBC WITH DIFFERENTIAL/PLATELET
Basophils Absolute: 0.1 10*3/uL (ref 0.0–0.1)
Basophils Relative: 1.4 % (ref 0.0–3.0)
Eosinophils Absolute: 0.1 10*3/uL (ref 0.0–0.7)
Eosinophils Relative: 1.5 % (ref 0.0–5.0)
HCT: 40 % (ref 36.0–46.0)
Hemoglobin: 13.6 g/dL (ref 12.0–15.0)
Lymphocytes Relative: 36.7 % (ref 12.0–46.0)
Lymphs Abs: 1.4 10*3/uL (ref 0.7–4.0)
MCHC: 34 g/dL (ref 30.0–36.0)
MCV: 88.5 fl (ref 78.0–100.0)
Monocytes Absolute: 0.5 10*3/uL (ref 0.1–1.0)
Monocytes Relative: 13.2 % — ABNORMAL HIGH (ref 3.0–12.0)
Neutro Abs: 1.8 10*3/uL (ref 1.4–7.7)
Neutrophils Relative %: 47.2 % (ref 43.0–77.0)
Platelets: 209 10*3/uL (ref 150.0–400.0)
RBC: 4.51 Mil/uL (ref 3.87–5.11)
RDW: 13.1 % (ref 11.5–15.5)
WBC: 3.8 10*3/uL — ABNORMAL LOW (ref 4.0–10.5)

## 2022-05-04 ENCOUNTER — Encounter: Payer: Self-pay | Admitting: Nurse Practitioner

## 2022-05-15 ENCOUNTER — Other Ambulatory Visit: Payer: Self-pay

## 2022-05-15 NOTE — Telephone Encounter (Signed)
Last visit 01/09/2022 Next 01/14/2023

## 2022-05-16 MED ORDER — FLUTICASONE-SALMETEROL 100-50 MCG/ACT IN AEPB: INHALATION_SPRAY | RESPIRATORY_TRACT | 3 refills | Status: DC

## 2022-06-08 ENCOUNTER — Other Ambulatory Visit: Payer: Self-pay | Admitting: Nurse Practitioner

## 2022-06-08 DIAGNOSIS — Z1231 Encounter for screening mammogram for malignant neoplasm of breast: Secondary | ICD-10-CM

## 2022-07-20 ENCOUNTER — Other Ambulatory Visit: Payer: Self-pay | Admitting: Nurse Practitioner

## 2022-07-20 DIAGNOSIS — J45909 Unspecified asthma, uncomplicated: Secondary | ICD-10-CM

## 2022-07-30 ENCOUNTER — Other Ambulatory Visit: Payer: PPO

## 2022-08-06 ENCOUNTER — Other Ambulatory Visit: Payer: Self-pay | Admitting: Nurse Practitioner

## 2022-08-06 DIAGNOSIS — J45909 Unspecified asthma, uncomplicated: Secondary | ICD-10-CM

## 2022-09-25 ENCOUNTER — Other Ambulatory Visit: Payer: Self-pay | Admitting: Nurse Practitioner

## 2022-09-25 NOTE — Telephone Encounter (Signed)
FLUTICASONE-SALMETEROL  100-64mcg/act  LAST APPOINTMENT DATE: 01/09/22 (Screening appt.)   NEXT APPOINTMENT DATE: 01/14/2023 (Physical)    LAST REFILL: 05/16/22  QTY:  #60 each 3RF

## 2022-11-08 ENCOUNTER — Other Ambulatory Visit: Payer: Self-pay | Admitting: Nurse Practitioner

## 2022-11-16 ENCOUNTER — Other Ambulatory Visit: Payer: Self-pay | Admitting: Nurse Practitioner

## 2022-11-16 DIAGNOSIS — E039 Hypothyroidism, unspecified: Secondary | ICD-10-CM

## 2023-01-14 ENCOUNTER — Encounter: Payer: Self-pay | Admitting: Nurse Practitioner

## 2023-01-14 ENCOUNTER — Ambulatory Visit (INDEPENDENT_AMBULATORY_CARE_PROVIDER_SITE_OTHER): Payer: PPO | Admitting: Nurse Practitioner

## 2023-01-14 VITALS — BP 138/80 | HR 96 | Temp 99.3°F | Ht 60.05 in | Wt 117.6 lb

## 2023-01-14 DIAGNOSIS — J4540 Moderate persistent asthma, uncomplicated: Secondary | ICD-10-CM

## 2023-01-14 DIAGNOSIS — M810 Age-related osteoporosis without current pathological fracture: Secondary | ICD-10-CM

## 2023-01-14 DIAGNOSIS — E7800 Pure hypercholesterolemia, unspecified: Secondary | ICD-10-CM | POA: Diagnosis not present

## 2023-01-14 DIAGNOSIS — R600 Localized edema: Secondary | ICD-10-CM | POA: Insufficient documentation

## 2023-01-14 DIAGNOSIS — E039 Hypothyroidism, unspecified: Secondary | ICD-10-CM | POA: Diagnosis not present

## 2023-01-14 DIAGNOSIS — Z1211 Encounter for screening for malignant neoplasm of colon: Secondary | ICD-10-CM

## 2023-01-14 DIAGNOSIS — G809 Cerebral palsy, unspecified: Secondary | ICD-10-CM

## 2023-01-14 DIAGNOSIS — Z0000 Encounter for general adult medical examination without abnormal findings: Secondary | ICD-10-CM | POA: Diagnosis not present

## 2023-01-14 DIAGNOSIS — Z1231 Encounter for screening mammogram for malignant neoplasm of breast: Secondary | ICD-10-CM

## 2023-01-14 NOTE — Assessment & Plan Note (Signed)
History of the same.  Patient use a walker and/or cane to help her around.  Continue

## 2023-01-14 NOTE — Progress Notes (Signed)
Established Patient Office Visit  Subjective   Patient ID: Karen Elliott, female    DOB: 05/04/1965  Age: 57 y.o. MRN: [id number]  Chief Complaint  Patient presents with   Annual Exam    Pt states she only has 1 refill left for all medications.     HPI   Asthma: Patient currently maintained on Advair, Singulair, and albuterol. States that she does not need it often. Less than once a week.  Patient denies orthopnea or PND  Hypothyroidism: Patient currently maintained on levothyroxine 75 mcg.  Tolerates medication well.  Osteoporosis: Alendronate 35 mg  Cerebral palsy: Historical diagnosis.  Patient does use ambulation aids. States that she will use a walker and cane.  She denies any recent falls  for complete physical and follow up of chronic conditions.  Immunizations: -Tetanus: Completed in unsure get at local pharmacy -Influenza: update today  -Shingles: Get at local pharmacy -Pneumonia: Too young  Diet: Fair diet. Trying to eat 3 meals. Some snacking through the day. She loves yougrut with frozen fruit. States she is drinking water and will do juice Exercise:  Walk daily   Eye exam: Completes annually. Wears glasses  Dental exam: Completes semi-annually    Colonoscopy: needs updating.  cologuard ordered today Lung Cancer Screening: N/A  Pap Smear: needs updating. Refused.  Offered to send patient to a female provider GYN she still declines  DEXA: needs updating.  Order placed  Mammogram: needs updating.  Order placed        Review of Systems  Constitutional:  Negative for chills and fever.  Respiratory:  Negative for shortness of breath.   Cardiovascular:  Negative for chest pain and leg swelling.  Gastrointestinal:  Negative for abdominal pain, blood in stool, constipation, diarrhea, nausea and vomiting.       BM every other day   Genitourinary:  Negative for dysuria and hematuria.  Neurological:  Negative for tingling and headaches.   Psychiatric/Behavioral:  Negative for hallucinations and suicidal ideas.       Objective:     BP 138/80   Pulse 96   Temp 99.3 F (37.4 C) (Oral)   Ht 5' 0.05" (1.525 m)   Wt 117 lb 9.6 oz (53.3 kg)   SpO2 96%   BMI 22.93 kg/m  BP Readings from Last 3 Encounters:  01/14/23 138/80  01/09/22 132/74  07/06/21 (!) 144/92   Wt Readings from Last 3 Encounters:  01/14/23 117 lb 9.6 oz (53.3 kg)  01/09/22 108 lb 2 oz (49 kg)  07/06/21 107 lb (48.5 kg)      Physical Exam Vitals and nursing note reviewed.  Constitutional:      Appearance: Normal appearance.  HENT:     Right Ear: Tympanic membrane, ear canal and external ear normal.     Left Ear: Tympanic membrane, ear canal and external ear normal.     Mouth/Throat:     Mouth: Mucous membranes are moist.     Pharynx: Oropharynx is clear.  Eyes:     Extraocular Movements: Extraocular movements intact.     Pupils: Pupils are equal, round, and reactive to light.  Cardiovascular:     Rate and Rhythm: Normal rate and regular rhythm.     Pulses: Normal pulses.     Heart sounds: Normal heart sounds.  Pulmonary:     Effort: Pulmonary effort is normal.     Breath sounds: Normal breath sounds.  Abdominal:     General: Bowel sounds are normal.  There is no distension.     Palpations: There is no mass.     Tenderness: There is no abdominal tenderness.     Hernia: No hernia is present.  Musculoskeletal:     Right lower leg: Edema present.     Left lower leg: Edema present.  Lymphadenopathy:     Cervical: No cervical adenopathy.  Skin:    General: Skin is warm.  Neurological:     General: No focal deficit present.     Mental Status: She is alert.     Deep Tendon Reflexes:     Reflex Scores:      Bicep reflexes are 2+ on the right side and 2+ on the left side.      Patellar reflexes are 2+ on the right side and 2+ on the left side.    Comments: Bilateral upper and lower extremity strength 5/5  Psychiatric:        Mood and  Affect: Mood normal.        Behavior: Behavior normal.        Thought Content: Thought content normal.        Judgment: Judgment normal.      No results found for any visits on 01/14/23.    The 10-year ASCVD risk score (Arnett DK, et al., 2019) is: 2.1%    Assessment & Plan:   Problem List Items Addressed This Visit       Respiratory   Moderate asthma without complication    Patient currently maintained on Advair, Singulair, albuterol as needed.  Patient stable continue medication as prescribed        Endocrine   Acquired hypothyroidism    Patient currently maintained on levothyroxine 75 mcg daily.  Continue medication as prescribed.  Pending TSH      Relevant Orders   TSH     Nervous and Auditory   Cerebral palsy (HCC)    History of the same.  Patient use a walker and/or cane to help her around.  Continue        Musculoskeletal and Integument   Age-related osteoporosis without current pathological fracture    History of the same patient needs a DEXA scan.  Order placed again today.  Patient was given information to call and get this scheduled continue alendronate      Relevant Orders   VITAMIN D 25 Hydroxy (Vit-D Deficiency, Fractures)   DG Bone Density     Other   Preventative health care - Primary    Discussed age-appropriate immunizations and screening exams.  Did review patient's personal, surgical, social, family histories.  Patient is up-to-date on all age-appropriate vaccinations that she would like.  We did update flu vaccine today.  Patient get tetanus and shingles vaccine at local pharmacy.  Patient needs CRC screening updated.  Cologuard ordered today.  Patient declined cervical cancer screening.  Order for mammogram placed today for breast cancer screening.  DEXA scan placed today for monitoring of osteoporosis treatment.  Patient was given information at discharge about preventative healthcare maintenance with anticipatory guidance      Relevant  Orders   CBC   Comprehensive metabolic panel   TSH   Hypercholesterolemia    History of the same pending lipid panel today      Relevant Orders   Lipid panel   Lower extremity edema    Likely dependent in nature.  Pending BNP today      Relevant Orders   Brain natriuretic peptide   Other  Visit Diagnoses     Screening mammogram for breast cancer       Relevant Orders   MM 3D SCREENING MAMMOGRAM BILATERAL BREAST   Screening for colon cancer       Relevant Orders   Cologuard       Return in about 1 year (around 01/14/2024) for CPE and Labs.    Audria Nine, NP

## 2023-01-14 NOTE — Assessment & Plan Note (Signed)
Likely dependent in nature.  Pending BNP today

## 2023-01-14 NOTE — Assessment & Plan Note (Signed)
Patient currently maintained on Advair, Singulair, albuterol as needed.  Patient stable continue medication as prescribed

## 2023-01-14 NOTE — Assessment & Plan Note (Signed)
Discussed age-appropriate immunizations and screening exams.  Did review patient's personal, surgical, social, family histories.  Patient is up-to-date on all age-appropriate vaccinations that she would like.  We did update flu vaccine today.  Patient get tetanus and shingles vaccine at local pharmacy.  Patient needs CRC screening updated.  Cologuard ordered today.  Patient declined cervical cancer screening.  Order for mammogram placed today for breast cancer screening.  DEXA scan placed today for monitoring of osteoporosis treatment.  Patient was given information at discharge about preventative healthcare maintenance with anticipatory guidance

## 2023-01-14 NOTE — Assessment & Plan Note (Signed)
History of the same patient needs a DEXA scan.  Order placed again today.  Patient was given information to call and get this scheduled continue alendronate

## 2023-01-14 NOTE — Assessment & Plan Note (Signed)
Patient currently maintained on levothyroxine 75 mcg daily.  Continue medication as prescribed.  Pending TSH

## 2023-01-14 NOTE — Assessment & Plan Note (Signed)
History of the same pending lipid panel today

## 2023-01-14 NOTE — Patient Instructions (Signed)
Nice to see you today I will be in touch with the labs once I have reviewed them Follow up with me in 1 year, sooner If you need me

## 2023-01-15 LAB — COMPREHENSIVE METABOLIC PANEL
ALT: 20 U/L (ref 0–35)
AST: 20 U/L (ref 0–37)
Albumin: 4.3 g/dL (ref 3.5–5.2)
Alkaline Phosphatase: 107 U/L (ref 39–117)
BUN: 13 mg/dL (ref 6–23)
CO2: 27 meq/L (ref 19–32)
Calcium: 9.2 mg/dL (ref 8.4–10.5)
Chloride: 106 meq/L (ref 96–112)
Creatinine, Ser: 0.58 mg/dL (ref 0.40–1.20)
GFR: 100.52 mL/min (ref >60.00)
Glucose, Bld: 102 mg/dL — ABNORMAL HIGH (ref 70–99)
Potassium: 3.7 meq/L (ref 3.5–5.1)
Sodium: 145 meq/L (ref 135–145)
Total Bilirubin: 0.3 mg/dL (ref 0.2–1.2)
Total Protein: 6.5 g/dL (ref 6.0–8.3)

## 2023-01-15 LAB — VITAMIN D 25 HYDROXY (VIT D DEFICIENCY, FRACTURES): VITD: 37.78 ng/mL (ref 30.00–100.00)

## 2023-01-15 LAB — TSH: TSH: 0.72 u[IU]/mL (ref 0.35–5.50)

## 2023-01-15 LAB — CBC
HCT: 38.9 % (ref 36.0–46.0)
Hemoglobin: 12.5 g/dL (ref 12.0–15.0)
MCHC: 32 g/dL (ref 30.0–36.0)
MCV: 90.7 fL (ref 78.0–100.0)
Platelets: 241 10*3/uL (ref 150.0–400.0)
RBC: 4.29 Mil/uL (ref 3.87–5.11)
RDW: 13.5 % (ref 11.5–15.5)
WBC: 7.4 10*3/uL (ref 4.0–10.5)

## 2023-01-15 LAB — LIPID PANEL
Cholesterol: 201 mg/dL — ABNORMAL HIGH (ref 0–200)
HDL: 97.7 mg/dL (ref >39.00)
LDL Cholesterol: 93 mg/dL (ref 0–99)
NonHDL: 102.97
Total CHOL/HDL Ratio: 2
Triglycerides: 51 mg/dL (ref 0.0–149.0)
VLDL: 10.2 mg/dL (ref 0.0–40.0)

## 2023-01-15 LAB — BRAIN NATRIURETIC PEPTIDE: Pro B Natriuretic peptide (BNP): 73 pg/mL (ref 0.0–100.0)

## 2023-02-03 LAB — COLOGUARD

## 2023-02-25 ENCOUNTER — Other Ambulatory Visit: Payer: Self-pay | Admitting: Nurse Practitioner

## 2023-02-25 DIAGNOSIS — J45909 Unspecified asthma, uncomplicated: Secondary | ICD-10-CM

## 2023-03-15 ENCOUNTER — Other Ambulatory Visit: Payer: Self-pay | Admitting: Nurse Practitioner

## 2023-06-19 ENCOUNTER — Other Ambulatory Visit: Payer: Self-pay | Admitting: Nurse Practitioner

## 2023-07-29 ENCOUNTER — Other Ambulatory Visit: Payer: Self-pay | Admitting: Nurse Practitioner

## 2023-07-29 DIAGNOSIS — E039 Hypothyroidism, unspecified: Secondary | ICD-10-CM

## 2023-08-29 ENCOUNTER — Other Ambulatory Visit: Payer: Self-pay | Admitting: Nurse Practitioner

## 2023-08-29 DIAGNOSIS — J45909 Unspecified asthma, uncomplicated: Secondary | ICD-10-CM

## 2023-10-16 ENCOUNTER — Other Ambulatory Visit: Payer: Self-pay | Admitting: Nurse Practitioner

## 2024-01-16 ENCOUNTER — Ambulatory Visit: Payer: PPO | Admitting: Nurse Practitioner

## 2024-01-16 ENCOUNTER — Encounter: Payer: Self-pay | Admitting: Nurse Practitioner

## 2024-01-16 VITALS — BP 130/80 | HR 100 | Temp 97.9°F | Ht 60.05 in | Wt 124.0 lb

## 2024-01-16 DIAGNOSIS — E039 Hypothyroidism, unspecified: Secondary | ICD-10-CM | POA: Diagnosis not present

## 2024-01-16 DIAGNOSIS — Z23 Encounter for immunization: Secondary | ICD-10-CM | POA: Diagnosis not present

## 2024-01-16 DIAGNOSIS — J4540 Moderate persistent asthma, uncomplicated: Secondary | ICD-10-CM

## 2024-01-16 DIAGNOSIS — E7800 Pure hypercholesterolemia, unspecified: Secondary | ICD-10-CM | POA: Diagnosis not present

## 2024-01-16 DIAGNOSIS — G809 Cerebral palsy, unspecified: Secondary | ICD-10-CM

## 2024-01-16 DIAGNOSIS — J45909 Unspecified asthma, uncomplicated: Secondary | ICD-10-CM | POA: Diagnosis not present

## 2024-01-16 DIAGNOSIS — Z0000 Encounter for general adult medical examination without abnormal findings: Secondary | ICD-10-CM

## 2024-01-16 DIAGNOSIS — Z131 Encounter for screening for diabetes mellitus: Secondary | ICD-10-CM | POA: Diagnosis not present

## 2024-01-16 DIAGNOSIS — M810 Age-related osteoporosis without current pathological fracture: Secondary | ICD-10-CM | POA: Diagnosis not present

## 2024-01-16 DIAGNOSIS — Z1231 Encounter for screening mammogram for malignant neoplasm of breast: Secondary | ICD-10-CM

## 2024-01-16 DIAGNOSIS — Z1211 Encounter for screening for malignant neoplasm of colon: Secondary | ICD-10-CM | POA: Diagnosis not present

## 2024-01-16 MED ORDER — MONTELUKAST SODIUM 10 MG PO TABS: 10.0000 mg | ORAL_TABLET | Freq: Every day | ORAL | 5 refills | Status: AC

## 2024-01-16 MED ORDER — LEVOTHYROXINE SODIUM 75 MCG PO TABS: 75.0000 ug | ORAL_TABLET | Freq: Every day | ORAL | 1 refills | Status: AC

## 2024-01-16 MED ORDER — ALBUTEROL SULFATE HFA 108 (90 BASE) MCG/ACT IN AERS
2.0000 | INHALATION_SPRAY | Freq: Four times a day (QID) | RESPIRATORY_TRACT | 1 refills | Status: AC | PRN
Start: 2024-01-16 — End: ?

## 2024-01-16 MED ORDER — CETIRIZINE HCL 10 MG PO TABS: 10.0000 mg | ORAL_TABLET | Freq: Every day | ORAL | 5 refills | Status: AC

## 2024-01-16 MED ORDER — FLUTICASONE-SALMETEROL 100-50 MCG/ACT IN AEPB: INHALATION_SPRAY | RESPIRATORY_TRACT | 11 refills | Status: AC

## 2024-01-16 NOTE — Assessment & Plan Note (Signed)
 Discussed age-appropriate immunizations and screening exams.  Did review patient's personal, surgical, social, family histories.  Patient is up-to-date with all age-appropriate vaccinations she would like.  Update flu and Prevnar 20 today.  Patient get tetanus and shingles vaccine at local pharmacy.  Reorder Cologuard for CRC screening.  Mammogram ordered for breast cancer screening.  DEXA scan ordered for monitoring osteoporosis.  Patient was given information at discharge about preventative healthcare maintenance with anticipatory guidance

## 2024-01-16 NOTE — Assessment & Plan Note (Signed)
 On alendronate  35 mg weekly.  Patient overdue for DEXA scan.  Ordered today patient given information to call and schedule at her convenience

## 2024-01-16 NOTE — Progress Notes (Signed)
 Established Patient Office Visit  Subjective   Patient ID: Karen Elliott, female    DOB: April 13, 1965  Age: 58 y.o. MRN: [id number]  Chief Complaint  Patient presents with   Annual Exam    HPI  Lung disease: Patient currently maintained on Advair  100-50 mcg and albuterol  inhaler as needed and Singulair  10 mg daily. States that she ran out of the advair . She is using the albuterol  less than weekly   Osteoporosis: Currently maintained on alendronate  35 mg once a week.  Overdue for bone density  Hypothyroidism: Currently maintained on levothyroxine  75 mcg daily  for complete physical and follow up of chronic conditions.  Immunizations: -Tetanus: get at local pharmacy  -Influenza: Update today -Shingles: get at local pharmacy  -Pneumonia: update today   Diet: Fair diet. She is eating 2-3 meals. Does snack on cheese and frozen bananas. She does drink water and juice and green tea. Exercise: No regular exercise.  Eye exam: Completes annually. Wears glasses  Dental exam: Completes semi-annually    Colonoscopy: Completed in 01/30/2023 patient did Cologuard but sample could not be processed Lung Cancer Screening: NA  Pap smear: refused   Mammogram: Order was placed but never completed.  Reordered today  DEXA: Last T-score was -3.4 repeat DEXA was ordered but never completed to be ordered today  Sleep: gets 6-8 hours when she is undisturbed and feels rested      Review of Systems  Constitutional:  Negative for chills and fever.  Respiratory:  Negative for shortness of breath.   Cardiovascular:  Negative for chest pain and leg swelling.  Gastrointestinal:  Negative for abdominal pain, blood in stool, constipation, diarrhea, nausea and vomiting.       Bm Daily   Genitourinary:  Negative for dysuria and hematuria.  Neurological:  Negative for tingling and headaches.  Psychiatric/Behavioral:  Negative for hallucinations and suicidal ideas.       Objective:     BP  130/80   Pulse 100   Temp 97.9 F (36.6 C) (Oral)   Ht 5' 0.05 (1.525 m)   Wt 124 lb (56.2 kg)   SpO2 97%   BMI 24.18 kg/m  BP Readings from Last 3 Encounters:  01/16/24 130/80  01/14/23 138/80  01/09/22 132/74   Wt Readings from Last 3 Encounters:  01/16/24 124 lb (56.2 kg)  01/14/23 117 lb 9.6 oz (53.3 kg)  01/09/22 108 lb 2 oz (49 kg)   SpO2 Readings from Last 3 Encounters:  01/16/24 97%  01/14/23 96%  01/09/22 98%      Physical Exam Vitals and nursing note reviewed.  Constitutional:      Appearance: Normal appearance.  HENT:     Right Ear: Tympanic membrane, ear canal and external ear normal.     Left Ear: Tympanic membrane, ear canal and external ear normal.     Mouth/Throat:     Mouth: Mucous membranes are moist.     Pharynx: Oropharynx is clear.  Eyes:     Extraocular Movements: Extraocular movements intact.     Pupils: Pupils are equal, round, and reactive to light.  Cardiovascular:     Rate and Rhythm: Normal rate and regular rhythm.     Pulses: Normal pulses.     Heart sounds: Normal heart sounds.  Pulmonary:     Effort: Pulmonary effort is normal.     Breath sounds: Normal breath sounds.  Abdominal:     General: Bowel sounds are normal. There is no distension.  Palpations: There is no mass.     Tenderness: There is no abdominal tenderness.     Hernia: No hernia is present.  Musculoskeletal:     Right lower leg: No edema.     Left lower leg: No edema.  Lymphadenopathy:     Cervical: No cervical adenopathy.  Skin:    General: Skin is warm.  Neurological:     General: No focal deficit present.     Mental Status: She is alert.     Deep Tendon Reflexes:     Reflex Scores:      Bicep reflexes are 2+ on the right side and 2+ on the left side.      Patellar reflexes are 2+ on the right side and 2+ on the left side.    Comments: Bilateral upper and lower extremity strength 5/5  Altered gait. Uses a walker   Psychiatric:        Mood and  Affect: Mood normal.        Behavior: Behavior normal.        Thought Content: Thought content normal.        Judgment: Judgment normal.      No results found for any visits on 01/16/24.    The 10-year ASCVD risk score (Arnett DK, et al., 2019) is: 1.8%    Assessment & Plan:   Problem List Items Addressed This Visit       Respiratory   Moderate asthma without complication   Currently maintained on Advair , Singulair , albuterol  as needed.  Well-controlled.  Refills provided today.  Continue medication as prescribed      Relevant Medications   fluticasone -salmeterol (ADVAIR ) 100-50 MCG/ACT AEPB   cetirizine  (ZYRTEC ) 10 MG tablet   montelukast  (SINGULAIR ) 10 MG tablet   albuterol  (VENTOLIN  HFA) 108 (90 Base) MCG/ACT inhaler     Endocrine   Acquired hypothyroidism   Currently maintained on levothyroxine  75 mcg daily.  Pending TSH continue medication as prescribed      Relevant Medications   levothyroxine  (SYNTHROID ) 75 MCG tablet     Nervous and Auditory   Cerebral palsy (HCC)   Altered gait.  Patient uses elevated shoe insert from [employer].  Patient also uses a walker        Musculoskeletal and Integument   Age-related osteoporosis without current pathological fracture   On alendronate  35 mg weekly.  Patient overdue for DEXA scan.  Ordered today patient given information to call and schedule at her convenience      Relevant Orders   VITAMIN D  25 Hydroxy (Vit-D Deficiency, Fractures)   DG Bone Density     Other   Preventative health care - Primary   Discussed age-appropriate immunizations and screening exams.  Did review patient's personal, surgical, social, family histories.  Patient is up-to-date with all age-appropriate vaccinations she would like.  Update flu and Prevnar 20 today.  Patient get tetanus and shingles vaccine at local pharmacy.  Reorder Cologuard for CRC screening.  Mammogram ordered for breast cancer screening.  DEXA scan ordered for  monitoring osteoporosis.  Patient was given information at discharge about preventative healthcare maintenance with anticipatory guidance      Relevant Orders   CBC with Differential/Platelet   Comprehensive metabolic panel with GFR   TSH   Hypercholesterolemia   History of the same pending lipid panel today      Relevant Orders   Lipid panel   Other Visit Diagnoses       Screening mammogram  for breast cancer       Relevant Orders   MM 3D SCREENING MAMMOGRAM BILATERAL BREAST     Screening for diabetes mellitus       Relevant Orders   Hemoglobin A1c     Need for pneumococcal 20-valent conjugate vaccination       Relevant Orders   Pneumococcal conjugate vaccine 20-valent (Prevnar 20) (Completed)     Need for influenza vaccination       Relevant Orders   Flu vaccine trivalent PF, 6mos and older(Flulaval ,Afluria,Fluarix,Fluzone) (Completed)     Screening for colon cancer       Relevant Orders   Cologuard       Return in about 1 year (around 01/15/2025) for CPE and Labs.    Adina Crandall, NP

## 2024-01-16 NOTE — Patient Instructions (Addendum)
 Nice to see you today I will be in touch with the labs once I have them  Follow up with me in 1 year, sooner if you need me  We did update your flu and pneumonia vaccines today

## 2024-01-16 NOTE — Assessment & Plan Note (Signed)
 Currently maintained on levothyroxine  75 mcg daily.  Pending TSH continue medication as prescribed

## 2024-01-16 NOTE — Assessment & Plan Note (Signed)
 History of the same pending lipid panel today

## 2024-01-16 NOTE — Assessment & Plan Note (Signed)
 Currently maintained on Advair , Singulair , albuterol  as needed.  Well-controlled.  Refills provided today.  Continue medication as prescribed

## 2024-01-16 NOTE — Assessment & Plan Note (Signed)
 Altered gait.  Patient uses elevated shoe insert from [employer].  Patient also uses a walker

## 2024-01-17 LAB — COMPREHENSIVE METABOLIC PANEL WITH GFR
ALT: 24 U/L (ref 0–35)
AST: 30 U/L (ref 0–37)
Albumin: 4.4 g/dL (ref 3.5–5.2)
Alkaline Phosphatase: 114 U/L (ref 39–117)
BUN: 15 mg/dL (ref 6–23)
CO2: 29 meq/L (ref 19–32)
Calcium: 9.1 mg/dL (ref 8.4–10.5)
Chloride: 103 meq/L (ref 96–112)
Creatinine, Ser: 0.63 mg/dL (ref 0.40–1.20)
GFR: 97.84 mL/min (ref >60.00)
Glucose, Bld: 108 mg/dL — ABNORMAL HIGH (ref 70–99)
Potassium: 4 meq/L (ref 3.5–5.1)
Sodium: 140 meq/L (ref 135–145)
Total Bilirubin: 0.2 mg/dL (ref 0.2–1.2)
Total Protein: 6.7 g/dL (ref 6.0–8.3)

## 2024-01-17 LAB — HEMOGLOBIN A1C: Hgb A1c MFr Bld: 5.9 % (ref 4.6–6.5)

## 2024-01-17 LAB — LIPID PANEL
Cholesterol: 220 mg/dL — ABNORMAL HIGH (ref 0–200)
HDL: 72.9 mg/dL (ref >39.00)
LDL Cholesterol: 134 mg/dL — ABNORMAL HIGH (ref 0–99)
NonHDL: 146.95
Total CHOL/HDL Ratio: 3
Triglycerides: 67 mg/dL (ref 0.0–149.0)
VLDL: 13.4 mg/dL (ref 0.0–40.0)

## 2024-01-17 LAB — CBC WITH DIFFERENTIAL/PLATELET
Basophils Absolute: 0.1 K/uL (ref 0.0–0.1)
Basophils Relative: 1 % (ref 0.0–3.0)
Eosinophils Absolute: 0.1 K/uL (ref 0.0–0.7)
Eosinophils Relative: 1.5 % (ref 0.0–5.0)
HCT: 39.6 % (ref 36.0–46.0)
Hemoglobin: 13.4 g/dL (ref 12.0–15.0)
Lymphocytes Relative: 29.6 % (ref 12.0–46.0)
Lymphs Abs: 1.8 K/uL (ref 0.7–4.0)
MCHC: 33.8 g/dL (ref 30.0–36.0)
MCV: 87.6 fl (ref 78.0–100.0)
Monocytes Absolute: 0.5 K/uL (ref 0.1–1.0)
Monocytes Relative: 8.5 % (ref 3.0–12.0)
Neutro Abs: 3.6 K/uL (ref 1.4–7.7)
Neutrophils Relative %: 59.4 % (ref 43.0–77.0)
Platelets: 221 K/uL (ref 150.0–400.0)
RBC: 4.52 Mil/uL (ref 3.87–5.11)
RDW: 13.1 % (ref 11.5–15.5)
WBC: 6 K/uL (ref 4.0–10.5)

## 2024-01-17 LAB — VITAMIN D 25 HYDROXY (VIT D DEFICIENCY, FRACTURES): VITD: 27.79 ng/mL — ABNORMAL LOW (ref 30.00–100.00)

## 2024-01-17 LAB — TSH: TSH: 2.25 u[IU]/mL (ref 0.35–5.50)

## 2024-01-20 ENCOUNTER — Ambulatory Visit: Payer: Self-pay | Admitting: Nurse Practitioner

## 2024-01-25 LAB — COLOGUARD

## 2024-02-03 LAB — COLOGUARD
